# Patient Record
Sex: Female | Born: 1940 | Race: White | Hispanic: No | Marital: Married | Smoking: Current every day smoker
Health system: Southern US, Community
[De-identification: ages and names within clinical notes are randomized; demographics above are authoritative.]

## PROBLEM LIST (undated history)

## (undated) DIAGNOSIS — I499 Cardiac arrhythmia, unspecified: Secondary | ICD-10-CM

## (undated) DIAGNOSIS — N189 Chronic kidney disease, unspecified: Secondary | ICD-10-CM

## (undated) DIAGNOSIS — J449 Chronic obstructive pulmonary disease, unspecified: Secondary | ICD-10-CM

## (undated) DIAGNOSIS — R251 Tremor, unspecified: Secondary | ICD-10-CM

## (undated) DIAGNOSIS — E119 Type 2 diabetes mellitus without complications: Secondary | ICD-10-CM

## (undated) DIAGNOSIS — E785 Hyperlipidemia, unspecified: Secondary | ICD-10-CM

## (undated) DIAGNOSIS — M1990 Unspecified osteoarthritis, unspecified site: Secondary | ICD-10-CM

## (undated) HISTORY — PX: TUBAL LIGATION: SHX77

---

## 2003-02-05 ENCOUNTER — Encounter: Payer: Self-pay | Admitting: Family Medicine

## 2003-02-05 ENCOUNTER — Encounter: Admission: RE | Admit: 2003-02-05 | Discharge: 2003-02-05 | Payer: Self-pay | Admitting: Family Medicine

## 2005-07-10 ENCOUNTER — Encounter: Admission: RE | Admit: 2005-07-10 | Discharge: 2005-07-10 | Payer: Self-pay | Admitting: Family Medicine

## 2009-07-06 ENCOUNTER — Encounter: Admission: RE | Admit: 2009-07-06 | Discharge: 2009-07-06 | Payer: Self-pay | Admitting: Family Medicine

## 2010-11-05 ENCOUNTER — Encounter: Payer: Self-pay | Admitting: Family Medicine

## 2011-05-18 ENCOUNTER — Other Ambulatory Visit: Payer: Self-pay | Admitting: Family Medicine

## 2011-05-18 DIAGNOSIS — R1903 Right lower quadrant abdominal swelling, mass and lump: Secondary | ICD-10-CM

## 2011-05-18 DIAGNOSIS — Z1231 Encounter for screening mammogram for malignant neoplasm of breast: Secondary | ICD-10-CM

## 2011-05-21 ENCOUNTER — Ambulatory Visit: Payer: Self-pay

## 2011-05-21 ENCOUNTER — Ambulatory Visit
Admission: RE | Admit: 2011-05-21 | Discharge: 2011-05-21 | Disposition: A | Discharge status: Home / Self Care | Payer: Medicare Other | Source: Ambulatory Visit | Attending: Family Medicine | Admitting: Family Medicine

## 2011-05-21 DIAGNOSIS — R1903 Right lower quadrant abdominal swelling, mass and lump: Secondary | ICD-10-CM

## 2011-05-21 MED ORDER — IOHEXOL 300 MG/ML  SOLN
100.0000 mL | Freq: Once | INTRAMUSCULAR | Status: AC | PRN
  Administered 2011-05-21: 100 mL via INTRAVENOUS

## 2011-05-22 ENCOUNTER — Ambulatory Visit
Admission: RE | Admit: 2011-05-22 | Discharge: 2011-05-22 | Disposition: A | Discharge status: Home / Self Care | Payer: Medicare Other | Source: Ambulatory Visit | Attending: Family Medicine | Admitting: Family Medicine

## 2011-05-22 DIAGNOSIS — Z1231 Encounter for screening mammogram for malignant neoplasm of breast: Secondary | ICD-10-CM

## 2011-05-28 ENCOUNTER — Other Ambulatory Visit: Payer: Self-pay | Admitting: Urology

## 2011-05-28 DIAGNOSIS — Q6239 Other obstructive defects of renal pelvis and ureter: Secondary | ICD-10-CM

## 2011-05-30 ENCOUNTER — Ambulatory Visit (HOSPITAL_COMMUNITY)
Admission: RE | Admit: 2011-05-30 | Discharge: 2011-05-30 | Disposition: A | Discharge status: Home / Self Care | Payer: Medicare Other | Source: Ambulatory Visit | Attending: Urology | Admitting: Urology

## 2011-05-30 DIAGNOSIS — N1330 Unspecified hydronephrosis: Secondary | ICD-10-CM | POA: Insufficient documentation

## 2011-05-30 DIAGNOSIS — N289 Disorder of kidney and ureter, unspecified: Secondary | ICD-10-CM | POA: Insufficient documentation

## 2011-05-30 DIAGNOSIS — N135 Crossing vessel and stricture of ureter without hydronephrosis: Secondary | ICD-10-CM | POA: Insufficient documentation

## 2011-05-30 LAB — PROTIME-INR: Prothrombin Time: 13 seconds (ref 11.6–15.2)

## 2011-05-30 LAB — BASIC METABOLIC PANEL
BUN: 15 mg/dL (ref 6–23)
CO2: 28 mEq/L (ref 19–32)
Calcium: 10.1 mg/dL (ref 8.4–10.5)
GFR calc non Af Amer: >60 mL/min (ref >60)
Glucose, Bld: 125 mg/dL — ABNORMAL HIGH (ref 70–99)
Sodium: 139 mEq/L (ref 135–145)

## 2011-05-30 LAB — CBC
MCH: 33.6 pg (ref 26.0–34.0)
MCHC: 34.6 g/dL (ref 30.0–36.0)
MCV: 97 fL (ref 78.0–100.0)
Platelets: 231 10*3/uL (ref 150–400)
RBC: 4.29 MIL/uL (ref 3.87–5.11)
RDW: 13.4 % (ref 11.5–15.5)

## 2011-05-31 ENCOUNTER — Other Ambulatory Visit: Payer: Self-pay | Admitting: Urology

## 2012-03-24 ENCOUNTER — Other Ambulatory Visit: Payer: Self-pay | Admitting: Urology

## 2012-03-24 DIAGNOSIS — N135 Crossing vessel and stricture of ureter without hydronephrosis: Secondary | ICD-10-CM

## 2012-04-02 ENCOUNTER — Encounter (HOSPITAL_COMMUNITY)
Admission: RE | Admit: 2012-04-02 | Discharge: 2012-04-02 | Disposition: A | Discharge status: Home / Self Care | Payer: Medicare Other | Source: Ambulatory Visit | Attending: Urology | Admitting: Urology

## 2012-04-02 DIAGNOSIS — N135 Crossing vessel and stricture of ureter without hydronephrosis: Secondary | ICD-10-CM | POA: Insufficient documentation

## 2012-04-02 DIAGNOSIS — N1330 Unspecified hydronephrosis: Secondary | ICD-10-CM | POA: Insufficient documentation

## 2012-04-02 DIAGNOSIS — N289 Disorder of kidney and ureter, unspecified: Secondary | ICD-10-CM | POA: Insufficient documentation

## 2012-04-02 MED ORDER — TECHNETIUM TC 99M MERTIATIDE
14.7000 | Freq: Once | INTRAVENOUS | Status: AC | PRN
  Administered 2012-04-02: 14.7 via INTRAVENOUS

## 2012-04-02 MED ORDER — FUROSEMIDE 10 MG/ML IJ SOLN
40.0000 mg | Freq: Once | INTRAMUSCULAR | Status: DC
  Filled 2012-04-02: qty 4

## 2012-09-09 ENCOUNTER — Other Ambulatory Visit: Payer: Self-pay | Admitting: Radiology

## 2012-09-09 ENCOUNTER — Other Ambulatory Visit: Payer: Self-pay | Admitting: Urology

## 2012-09-09 DIAGNOSIS — N1330 Unspecified hydronephrosis: Secondary | ICD-10-CM

## 2012-09-10 ENCOUNTER — Ambulatory Visit (HOSPITAL_COMMUNITY)
Admission: RE | Admit: 2012-09-10 | Discharge: 2012-09-10 | Disposition: A | Discharge status: Home / Self Care | Payer: Medicare Other | Source: Ambulatory Visit | Attending: Urology | Admitting: Urology

## 2012-09-10 ENCOUNTER — Other Ambulatory Visit: Payer: Self-pay | Admitting: Urology

## 2012-09-10 ENCOUNTER — Encounter (HOSPITAL_COMMUNITY): Payer: Self-pay

## 2012-09-10 DIAGNOSIS — N1330 Unspecified hydronephrosis: Secondary | ICD-10-CM

## 2012-09-10 DIAGNOSIS — Q6239 Other obstructive defects of renal pelvis and ureter: Secondary | ICD-10-CM | POA: Insufficient documentation

## 2012-09-10 HISTORY — DX: Type 2 diabetes mellitus without complications: E11.9

## 2012-09-10 HISTORY — DX: Chronic obstructive pulmonary disease, unspecified: J44.9

## 2012-09-10 HISTORY — DX: Hyperlipidemia, unspecified: E78.5

## 2012-09-10 LAB — CBC WITH DIFFERENTIAL/PLATELET
Basophils Absolute: 0 10*3/uL (ref 0.0–0.1)
Eosinophils Absolute: 0.1 10*3/uL (ref 0.0–0.7)
Eosinophils Relative: 1 % (ref 0–5)
HCT: 33.8 % — ABNORMAL LOW (ref 36.0–46.0)
Lymphocytes Relative: 14 % (ref 12–46)
Lymphs Abs: 1.8 10*3/uL (ref 0.7–4.0)
MCH: 30.2 pg (ref 26.0–34.0)
MCV: 92.1 fL (ref 78.0–100.0)
Monocytes Absolute: 1 10*3/uL (ref 0.1–1.0)
Platelets: 588 10*3/uL — ABNORMAL HIGH (ref 150–400)
RDW: 14.5 % (ref 11.5–15.5)
WBC: 12.4 10*3/uL — ABNORMAL HIGH (ref 4.0–10.5)

## 2012-09-10 LAB — PROTIME-INR: Prothrombin Time: 13 seconds (ref 11.6–15.2)

## 2012-09-10 MED ORDER — LIDOCAINE HCL 1 % IJ SOLN
INTRAMUSCULAR | Status: AC
  Filled 2012-09-10: qty 20

## 2012-09-10 MED ORDER — FENTANYL CITRATE 0.05 MG/ML IJ SOLN
INTRAMUSCULAR | Status: AC | PRN
  Administered 2012-09-10: 50 ug via INTRAVENOUS

## 2012-09-10 MED ORDER — MIDAZOLAM HCL 2 MG/2ML IJ SOLN
INTRAMUSCULAR | Status: AC | PRN
  Administered 2012-09-10 (×2): 1 mg via INTRAVENOUS

## 2012-09-10 MED ORDER — CIPROFLOXACIN IN D5W 400 MG/200ML IV SOLN
400.0000 mg | Freq: Once | INTRAVENOUS | Status: AC
  Administered 2012-09-10: 400 mg via INTRAVENOUS
  Filled 2012-09-10: qty 200

## 2012-09-10 MED ORDER — MIDAZOLAM HCL 2 MG/2ML IJ SOLN
INTRAMUSCULAR | Status: AC
  Filled 2012-09-10: qty 4

## 2012-09-10 MED ORDER — FENTANYL CITRATE 0.05 MG/ML IJ SOLN
INTRAMUSCULAR | Status: AC
  Filled 2012-09-10: qty 4

## 2012-09-10 MED ORDER — ONDANSETRON HCL 4 MG/2ML IJ SOLN: 4.0000 mg | Freq: Four times a day (QID) | INTRAMUSCULAR | Status: DC | PRN

## 2012-09-10 MED ORDER — SODIUM CHLORIDE 0.9 % IV SOLN: INTRAVENOUS | Status: DC

## 2012-09-10 MED ORDER — HYDROCODONE-ACETAMINOPHEN 5-325 MG PO TABS: 1.0000 | ORAL_TABLET | ORAL | Status: DC | PRN

## 2012-09-10 NOTE — Progress Notes (Signed)
RECOVERY in Short stay post aspiration Pt warm and dry see VS on Doc Flow sheet. Pt sitting HOB 45 degrees right lower quadrant drain with 75 cc of dark cherry blood

## 2012-09-10 NOTE — Progress Notes (Signed)
Caryn Bee B here to see patient. Pt states she is feeling better VSS unchanged from previous and pt was able to sit on edge of bed dangling and bp 94/63. Pt remains W&D color pale. Still has cherry red drainage in bag but only @ 300cc is preset in bad. Took pt ot BR via w/c and voided moderate am clear urine.Pt taken back to room via w/c and assisted with dressing.Pt eager to go home Demonstated to daughter how to empty leg bag and change dressing she verbalized understand. Gave daugher sterile 4x4 dressings and hypafix.cups to measure output

## 2012-09-10 NOTE — Progress Notes (Signed)
Pt took few sips of juice and c/o nausea . Dr Bonnielee Haff here to see pt. PRN orders received for nausea. Pt took few sips of ginger ale and nausea is subsiding. Pt lying on left side and wanting to sleep

## 2012-09-10 NOTE — Progress Notes (Signed)
Chief Complaint: "I'm here for a drain" Referring Physician:Dahlstedt HPI: Cheryl Griffin is an 71 y.o. female with hx of nonfunctioning right kidney and known UPJ obstruction causing recurrent hydronephros  Past Medical History:  Past Medical History  Diagnosis Date  . COPD (chronic obstructive pulmonary disease)   . DM (diabetes mellitus)   . Hyperlipidemia     Past Surgical History: No past surgical history on file.  Family History: No family history on file.  Social History:  reports that she has been smoking.  She does not have any smokeless tobacco history on file. She reports that she does not drink alcohol or use illicit drugs.  Allergies: No Known Allergies  Medications: Glucophage 1000mg  bid Spiriva Symbicort Simvistatin  Please HPI for pertinent positives, otherwise complete 10 system ROS negative.  Physical Exam: Blood pressure 95/63, pulse 117, temperature 98.3 F (36.8 C), weight 83 lb (37.649 kg), SpO2 97.00%. There is no height on file to calculate BMI.   General Appearance:  Alert, cooperative, no distress, appears stated age  Head:  Normocephalic, without obvious abnormality, atraumatic  ENT: Unremarkable  Neck: Supple, symmetrical, trachea midline, no adenopathy, thyroid: not enlarged, symmetric, no tenderness/mass/nodules  Lungs:   Clear to auscultation bilaterally, no w/r/r, respirations unlabored without use of accessory muscles.  Chest Wall:  No tenderness or deformity  Heart:  Regular rate and rhythm, S1, S2 normal, no murmur, rub or gallop. Carotids 2+ without bruit.  Abdomen:   Large NT, but tight mass effect in Rt mid-lower abdomen c/w findings on CT.  Neurologic: Normal affect, no gross deficits.   Results for orders placed during the hospital encounter of 09/10/12 (from the past 48 hour(s))  APTT     Status: Normal   Collection Time   09/10/12 10:35 AM      Component Value Range Comment   aPTT 29  24 - 37 seconds   CBC WITH DIFFERENTIAL      Status: Abnormal   Collection Time   09/10/12 10:35 AM      Component Value Range Comment   WBC 12.4 (*) 4.0 - 10.5 K/uL    RBC 3.67 (*) 3.87 - 5.11 MIL/uL    Hemoglobin 11.1 (*) 12.0 - 15.0 g/dL    HCT 16.1 (*) 09.6 - 46.0 %    MCV 92.1  78.0 - 100.0 fL    MCH 30.2  26.0 - 34.0 pg    MCHC 32.8  30.0 - 36.0 g/dL    RDW 04.5  40.9 - 81.1 %    Platelets 588 (*) 150 - 400 K/uL    Neutrophils Relative 77  43 - 77 %    Neutro Abs 9.5 (*) 1.7 - 7.7 K/uL    Lymphocytes Relative 14  12 - 46 %    Lymphs Abs 1.8  0.7 - 4.0 K/uL    Monocytes Relative 8  3 - 12 %    Monocytes Absolute 1.0  0.1 - 1.0 K/uL    Eosinophils Relative 1  0 - 5 %    Eosinophils Absolute 0.1  0.0 - 0.7 K/uL    Basophils Relative 0  0 - 1 %    Basophils Absolute 0.0  0.0 - 0.1 K/uL   PROTIME-INR     Status: Normal   Collection Time   09/10/12 10:35 AM      Component Value Range Comment   Prothrombin Time 13.0  11.6 - 15.2 seconds    INR 0.99  0.00 - 1.49  No results found.  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 Discussed perc nephrostomy tube placement with pt and family. Risks and complications, as well as post drain expectations discussed as well. Labs ok. Consent signed in chart  Brayton El PA-C 09/10/2012, 11:21 AM

## 2012-09-10 NOTE — Procedures (Signed)
R PCN for severe UPJ obstruction 10 Fr Bloody fluid No comp

## 2012-09-14 LAB — CULTURE, ROUTINE-ABSCESS: Culture: NO GROWTH

## 2012-09-18 ENCOUNTER — Other Ambulatory Visit: Payer: Self-pay | Admitting: Urology

## 2012-09-18 DIAGNOSIS — N1330 Unspecified hydronephrosis: Secondary | ICD-10-CM

## 2012-09-29 ENCOUNTER — Encounter (HOSPITAL_COMMUNITY)
Admission: RE | Admit: 2012-09-29 | Discharge: 2012-09-29 | Disposition: A | Discharge status: Home / Self Care | Payer: Medicare Other | Source: Ambulatory Visit | Attending: Urology | Admitting: Urology

## 2012-09-29 DIAGNOSIS — N1330 Unspecified hydronephrosis: Secondary | ICD-10-CM | POA: Insufficient documentation

## 2012-09-29 MED ORDER — TECHNETIUM TC 99M MERTIATIDE
15.0000 | Freq: Once | INTRAVENOUS | Status: AC | PRN
  Administered 2012-09-29: 15 via INTRAVENOUS

## 2012-10-07 ENCOUNTER — Other Ambulatory Visit: Payer: Self-pay | Admitting: Urology

## 2012-10-07 DIAGNOSIS — N135 Crossing vessel and stricture of ureter without hydronephrosis: Secondary | ICD-10-CM

## 2012-10-10 ENCOUNTER — Other Ambulatory Visit: Payer: Self-pay | Admitting: Physician Assistant

## 2012-10-13 ENCOUNTER — Ambulatory Visit (HOSPITAL_COMMUNITY)
Admission: RE | Admit: 2012-10-13 | Discharge: 2012-10-13 | Disposition: A | Discharge status: Home / Self Care | Payer: Medicare Other | Source: Ambulatory Visit | Attending: Urology | Admitting: Urology

## 2012-10-13 DIAGNOSIS — N135 Crossing vessel and stricture of ureter without hydronephrosis: Secondary | ICD-10-CM

## 2012-10-13 MED ORDER — LIDOCAINE HCL 1 % IJ SOLN
INTRAMUSCULAR | Status: AC
  Filled 2012-10-13: qty 20

## 2012-10-16 ENCOUNTER — Other Ambulatory Visit: Payer: Self-pay | Admitting: Urology

## 2012-10-23 ENCOUNTER — Other Ambulatory Visit (HOSPITAL_COMMUNITY): Payer: Self-pay | Admitting: Urology

## 2012-10-23 NOTE — Patient Instructions (Addendum)
20 KIFFANY SCHELLING  10/23/2012   Your procedure is scheduled on:  11/04/11  MONDAY  Report to [facility] [phone].  Call this number if you have problems the morning of surgery: [phone]       Remember: Janan Halter WITH YOU TO HOSPITAL  Do not eat food  Or drink :After Midnight. Sunday NIGHT   Take these medicines the morning of surgery with A SIP OF WATER:albuterol inhaler if needed, symbicort, spiriva   .  Contacts, dentures or partial plates can not be worn to surgery  Leave suitcase in the car. After surgery it may be brought to your room.  For patients admitted to the hospital, checkout time is 11:00 AM day of  discharge.             SPECIAL INSTRUCTIONS- SEE [facility] PREPARING FOR SURGERY INSTRUCTION SHEET-     DO NOT WEAR JEWELRY, LOTIONS, POWDERS, OR PERFUMES.  WOMEN-- DO NOT SHAVE LEGS OR UNDERARMS FOR 12 HOURS BEFORE SHOWERS. MEN MAY SHAVE FACE.  Patients discharged the day of surgery will not be allowed to drive home. IF going home the day of surgery, you must have a driver and someone to stay with you for the first 24 hours  Name and phone number of your driver:  admission                                                                      Please read over the following fact sheets that you were given: MRSA Information, Incentive Spirometry Sheet, Blood Transfusion Sheet  Information                                                                                   Cain Sieve   PST nurse [phone]  [phone] phone numbe                FAILURE TO FOLLOW THESE INSTRUCTIONS MAY RESULT IN  CANCELLATION   OF YOUR SURGERY                                                  Patient Signature _____________________________

## 2012-10-24 ENCOUNTER — Encounter (HOSPITAL_COMMUNITY)
Admission: RE | Admit: 2012-10-24 | Discharge: 2012-10-24 | Disposition: A | Discharge status: Home / Self Care | Payer: Medicare Other | Source: Ambulatory Visit | Attending: Urology | Admitting: Urology

## 2012-10-24 ENCOUNTER — Other Ambulatory Visit: Payer: Self-pay

## 2012-10-24 ENCOUNTER — Ambulatory Visit (HOSPITAL_COMMUNITY)
Admission: RE | Admit: 2012-10-24 | Discharge: 2012-10-24 | Disposition: A | Discharge status: Home / Self Care | Payer: Medicare Other | Source: Ambulatory Visit | Attending: Urology | Admitting: Urology

## 2012-10-24 ENCOUNTER — Emergency Department (HOSPITAL_COMMUNITY)
Admission: EM | Admit: 2012-10-24 | Discharge: 2012-10-24 | Disposition: A | Discharge status: Home / Self Care | Payer: Medicare Other | Attending: Emergency Medicine | Admitting: Emergency Medicine

## 2012-10-24 ENCOUNTER — Encounter (HOSPITAL_COMMUNITY): Payer: Self-pay | Admitting: Pharmacy Technician

## 2012-10-24 ENCOUNTER — Encounter (HOSPITAL_COMMUNITY): Payer: Self-pay

## 2012-10-24 ENCOUNTER — Encounter (HOSPITAL_COMMUNITY): Payer: Self-pay | Admitting: *Deleted

## 2012-10-24 DIAGNOSIS — R9431 Abnormal electrocardiogram [ECG] [EKG]: Secondary | ICD-10-CM

## 2012-10-24 DIAGNOSIS — J438 Other emphysema: Secondary | ICD-10-CM | POA: Insufficient documentation

## 2012-10-24 DIAGNOSIS — N135 Crossing vessel and stricture of ureter without hydronephrosis: Secondary | ICD-10-CM | POA: Insufficient documentation

## 2012-10-24 DIAGNOSIS — Z9889 Other specified postprocedural states: Secondary | ICD-10-CM | POA: Insufficient documentation

## 2012-10-24 DIAGNOSIS — J449 Chronic obstructive pulmonary disease, unspecified: Secondary | ICD-10-CM | POA: Insufficient documentation

## 2012-10-24 DIAGNOSIS — E119 Type 2 diabetes mellitus without complications: Secondary | ICD-10-CM | POA: Insufficient documentation

## 2012-10-24 DIAGNOSIS — F17200 Nicotine dependence, unspecified, uncomplicated: Secondary | ICD-10-CM | POA: Insufficient documentation

## 2012-10-24 DIAGNOSIS — Z79899 Other long term (current) drug therapy: Secondary | ICD-10-CM | POA: Insufficient documentation

## 2012-10-24 DIAGNOSIS — Z8739 Personal history of other diseases of the musculoskeletal system and connective tissue: Secondary | ICD-10-CM | POA: Insufficient documentation

## 2012-10-24 DIAGNOSIS — J4489 Other specified chronic obstructive pulmonary disease: Secondary | ICD-10-CM | POA: Insufficient documentation

## 2012-10-24 DIAGNOSIS — Z01810 Encounter for preprocedural cardiovascular examination: Secondary | ICD-10-CM | POA: Insufficient documentation

## 2012-10-24 DIAGNOSIS — E785 Hyperlipidemia, unspecified: Secondary | ICD-10-CM | POA: Insufficient documentation

## 2012-10-24 DIAGNOSIS — Z01812 Encounter for preprocedural laboratory examination: Secondary | ICD-10-CM | POA: Insufficient documentation

## 2012-10-24 HISTORY — DX: Unspecified osteoarthritis, unspecified site: M19.90

## 2012-10-24 HISTORY — DX: Tremor, unspecified: R25.1

## 2012-10-24 LAB — CBC WITH DIFFERENTIAL/PLATELET
Basophils Absolute: 0.1 10*3/uL (ref 0.0–0.1)
Basophils Relative: 0 % (ref 0–1)
Eosinophils Relative: 0 % (ref 0–5)
HCT: 42.8 % (ref 36.0–46.0)
MCHC: 32.7 g/dL (ref 30.0–36.0)
Monocytes Absolute: 0.9 10*3/uL (ref 0.1–1.0)
Neutro Abs: 8.4 10*3/uL — ABNORMAL HIGH (ref 1.7–7.7)
Platelets: 292 10*3/uL (ref 150–400)
RDW: 16.8 % — ABNORMAL HIGH (ref 11.5–15.5)
WBC: 12.2 10*3/uL — ABNORMAL HIGH (ref 4.0–10.5)

## 2012-10-24 LAB — BASIC METABOLIC PANEL
BUN: 14 mg/dL (ref 6–23)
Calcium: 10 mg/dL (ref 8.4–10.5)
Chloride: 101 mEq/L (ref 96–112)
Creatinine, Ser: 0.57 mg/dL (ref 0.50–1.10)
GFR calc Af Amer: >90 mL/min (ref >90)
GFR calc non Af Amer: 90 mL/min — ABNORMAL LOW (ref >90)

## 2012-10-24 LAB — CBC
HCT: 41.5 % (ref 36.0–46.0)
MCHC: 32.8 g/dL (ref 30.0–36.0)
Platelets: 314 10*3/uL (ref 150–400)
RDW: 16.9 % — ABNORMAL HIGH (ref 11.5–15.5)
WBC: 11.7 10*3/uL — ABNORMAL HIGH (ref 4.0–10.5)

## 2012-10-24 LAB — COMPREHENSIVE METABOLIC PANEL
ALT: 11 U/L (ref 0–35)
AST: 14 U/L (ref 0–37)
Albumin: 3.8 g/dL (ref 3.5–5.2)
Calcium: 10 mg/dL (ref 8.4–10.5)
Chloride: 100 mEq/L (ref 96–112)
Creatinine, Ser: 0.57 mg/dL (ref 0.50–1.10)
Sodium: 137 mEq/L (ref 135–145)

## 2012-10-24 LAB — SURGICAL PCR SCREEN: Staphylococcus aureus: POSITIVE — AB

## 2012-10-24 MED ORDER — ASPIRIN 325 MG PO TABS
325.0000 mg | ORAL_TABLET | Freq: Once | ORAL | Status: AC
  Administered 2012-10-24: 325 mg via ORAL
  Filled 2012-10-24: qty 1

## 2012-10-24 NOTE — ED Notes (Signed)
MVH:QION<GE> Expected date:<BR> Expected time:<BR> Means of arrival:<BR> Comments:<BR> From pre op, st elevation, chest pain

## 2012-10-24 NOTE — ED Notes (Signed)
Pt coming from pre-op, on EKG pt having ST elevation. Denies chest pain
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
## 2012-10-24 NOTE — Progress Notes (Signed)
Pt to er for acute mi st elevation on ekg, pt denies chest pain.

## 2012-10-24 NOTE — Progress Notes (Signed)
Left message with selita brasher for dr Retta Diones  pt to er for acute mi, st elevation on ekg

## 2012-11-03 ENCOUNTER — Encounter (HOSPITAL_COMMUNITY): Admission: RE | Payer: Self-pay | Source: Ambulatory Visit

## 2012-11-03 ENCOUNTER — Inpatient Hospital Stay (HOSPITAL_COMMUNITY): Admission: RE | Admit: 2012-11-03 | Payer: Medicare Other | Source: Ambulatory Visit | Admitting: Urology

## 2012-11-03 SURGERY — NEPHRECTOMY, RADICAL, LAPAROSCOPIC, ADULT
Anesthesia: General | Laterality: Right

## 2012-11-12 ENCOUNTER — Encounter: Payer: Self-pay | Admitting: Cardiovascular Disease

## 2012-11-12 ENCOUNTER — Encounter: Payer: Self-pay | Admitting: *Deleted

## 2012-11-12 DIAGNOSIS — M1990 Unspecified osteoarthritis, unspecified site: Secondary | ICD-10-CM | POA: Insufficient documentation

## 2012-11-12 DIAGNOSIS — E119 Type 2 diabetes mellitus without complications: Secondary | ICD-10-CM | POA: Insufficient documentation

## 2012-11-12 DIAGNOSIS — E785 Hyperlipidemia, unspecified: Secondary | ICD-10-CM | POA: Insufficient documentation

## 2012-11-12 DIAGNOSIS — R251 Tremor, unspecified: Secondary | ICD-10-CM | POA: Insufficient documentation

## 2012-11-12 DIAGNOSIS — J449 Chronic obstructive pulmonary disease, unspecified: Secondary | ICD-10-CM | POA: Insufficient documentation

## 2012-11-14 ENCOUNTER — Encounter: Payer: Self-pay | Admitting: Cardiovascular Disease

## 2012-11-14 ENCOUNTER — Encounter: Payer: Self-pay | Admitting: Pulmonary Disease

## 2012-11-14 ENCOUNTER — Ambulatory Visit (INDEPENDENT_AMBULATORY_CARE_PROVIDER_SITE_OTHER): Payer: Medicare Other | Admitting: Cardiovascular Disease

## 2012-11-14 ENCOUNTER — Ambulatory Visit (INDEPENDENT_AMBULATORY_CARE_PROVIDER_SITE_OTHER): Payer: Medicare Other | Admitting: Pulmonary Disease

## 2012-11-14 VITALS — BP 108/64 | HR 97 | Temp 97.8°F | Ht 65.0 in | Wt 84.2 lb

## 2012-11-14 VITALS — BP 110/82 | HR 100 | Ht 62.0 in | Wt 83.0 lb

## 2012-11-14 DIAGNOSIS — J449 Chronic obstructive pulmonary disease, unspecified: Secondary | ICD-10-CM

## 2012-11-14 DIAGNOSIS — R0600 Dyspnea, unspecified: Secondary | ICD-10-CM

## 2012-11-14 DIAGNOSIS — E785 Hyperlipidemia, unspecified: Secondary | ICD-10-CM

## 2012-11-14 DIAGNOSIS — E119 Type 2 diabetes mellitus without complications: Secondary | ICD-10-CM

## 2012-11-14 DIAGNOSIS — Z01810 Encounter for preprocedural cardiovascular examination: Secondary | ICD-10-CM | POA: Insufficient documentation

## 2012-11-14 DIAGNOSIS — R0609 Other forms of dyspnea: Secondary | ICD-10-CM

## 2012-11-14 NOTE — Progress Notes (Signed)
Patient ID: Cheryl Griffin, female   DOB: 1941/02/25, 72 y.o.   MRN: [id number] 72 yo referred for preop clearance.  No history of heart disease No chest pain  Preop ECG normal but incorrectly read by computer as IMI.  She still smokes and has severe COPD clinically.  She has not had this followed by anyone other than Dr Jeannetta Nap.  She has coughing and occasional wheezing.  No palpitations mild dyspnea no syncopel  Compliant with meds.  No systemic steroids.  She has chronic UPJ obstruction and needs urologic surgery with Dr Retta Diones.    ROS: Denies fever, malais, weight loss, blurry vision, decreased visual acuity, cough, sputum, SOB, hemoptysis, pleuritic pain, palpitaitons, heartburn, abdominal pain, melena, lower extremity edema, claudication, or rash.  All other systems reviewed and negative   General: Affect appropriate Thin frail COPDer HEENT: normal Neck supple with no adenopathy JVP normal no bruits no thyromegaly Lungs clear with no wheezing and good diaphragmatic motion Heart:  S1/S2 no murmur,rub, gallop or click PMI normal Abdomen: benighn, BS positve, no tenderness, no AAA no bruit.  No HSM or HJR Distal pulses intact with no bruits No edema Neuro non-focal Skin warm and dry No muscular weakness  Medications Current Outpatient Prescriptions  Medication Sig Dispense Refill  . albuterol (PROVENTIL HFA;VENTOLIN HFA) 108 (90 BASE) MCG/ACT inhaler Inhale 2 puffs into the lungs every 4 (four) hours as needed. Wheezing      . budesonide-formoterol (SYMBICORT) 160-4.5 MCG/ACT inhaler Inhale 2 puffs into the lungs 2 (two) times daily.       . Calcium Citrate-Vitamin D (CITRACAL + D PO) Take 1 tablet by mouth daily.      Marland Kitchen lovastatin (MEVACOR) 20 MG tablet Take 20 mg by mouth at bedtime.       . metFORMIN (GLUCOPHAGE) 1000 MG tablet Take 500 mg by mouth 2 (two) times daily.       Marland Kitchen tiotropium (SPIRIVA) 18 MCG inhalation capsule Place 18 mcg into inhaler and inhale every morning.          Allergies Review of patient's allergies indicates no known allergies.  Family History: No family history on file.  Social History: History   Social History  . Marital Status: Married    Spouse Name: N/A    Number of Children: N/A  . Years of Education: N/A   Occupational History  . Not on file.   Social History Main Topics  . Smoking status: Current Every Day Smoker -- 1.5 packs/day for 40 years    Types: Cigarettes  . Smokeless tobacco: Never Used  . Alcohol Use: No  . Drug Use: No  . Sexually Active: Not on file   Other Topics Concern  . Not on file   Social History Narrative  . No narrative on file    Electrocardiogram: 10/24/12  SR rate 77 normal read incorrectly by computer as IMI  Assessment and Plan

## 2012-11-14 NOTE — Assessment & Plan Note (Signed)
No known heart issues no chest pain or symptoms and normal cardiac exam and ECG  She is clear from our perspective but needs to see pulmonary before having surgery
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
## 2012-11-14 NOTE — Assessment & Plan Note (Signed)
Discussed low carb diet.  Target hemoglobin A1c is 6.5 or less.  Continue current medications.  

## 2012-11-14 NOTE — Patient Instructions (Signed)
Your physician recommends that you schedule a follow-up appointment in: AS NEEDED  Your physician recommends that you continue on your current medications as directed. Please refer to the Current Medication list given to you today.  You have been referred to PULMONARY   NEXT WEEK FOR PRE OP CLEARANCE  Your physician has recommended that you have a pulmonary function test. Pulmonary Function Tests are a group of tests that measure how well air moves in and out of your lungs.  Your physician has requested that you have an echocardiogram. Echocardiography is a painless test that uses sound waves to create images of your heart. It provides your doctor with information about the size and shape of your heart and how well your heart's chambers and valves are working. This procedure takes approximately one hour. There are no restrictions for this procedure. DX DYSPNEA

## 2012-11-14 NOTE — Assessment & Plan Note (Signed)
Cholesterol is at goal.  Continue current dose of statin and diet Rx.  No myalgias or side effects.  F/U  LFT's in 6 months. No results found for this basename: LDLCALC             

## 2012-11-14 NOTE — Patient Instructions (Signed)
You are cleared for surgery from a lung standpoint You do have moderate COPD- lung capacity at 60% Stay on symbicort, spiriva You are at moderate risk due to low body weight - pl discuss risks with dr Retta Diones

## 2012-11-14 NOTE — Assessment & Plan Note (Signed)
Her lung function does not contraindicate her laparoscopic surgery. She will continue on Symbicort and Spiriva. Albuterol nebs can be used every 6 hours when necessary perioperatively. Her biggest risk factor seems to be her malnutrition and low body weight. She will discuss her risks further with the surgeon before proceeding. DVT prophylaxis can be performed as routine. Incentive spirometry can be used post op if she has significant abdominal pain post surgery

## 2012-11-14 NOTE — Assessment & Plan Note (Signed)
Her biggest risk for surgery is pulmonary as she will need genral anesthesia and intubation.  She needs to be cleared by lung doctors  Will get echo to assess for RV and LV function and R/O cor pulmonale.  Pre op PFT;s pre and post bronchodilator.  No motivation to quit smoking

## 2012-11-19 ENCOUNTER — Other Ambulatory Visit: Payer: Self-pay | Admitting: Urology

## 2012-11-19 DIAGNOSIS — N135 Crossing vessel and stricture of ureter without hydronephrosis: Secondary | ICD-10-CM

## 2012-11-20 ENCOUNTER — Ambulatory Visit (HOSPITAL_COMMUNITY): Payer: Medicare Other | Attending: Family Medicine | Admitting: Radiology

## 2012-11-20 DIAGNOSIS — R0602 Shortness of breath: Secondary | ICD-10-CM

## 2012-11-20 DIAGNOSIS — R0600 Dyspnea, unspecified: Secondary | ICD-10-CM

## 2012-11-20 DIAGNOSIS — R0989 Other specified symptoms and signs involving the circulatory and respiratory systems: Secondary | ICD-10-CM | POA: Insufficient documentation

## 2012-11-20 DIAGNOSIS — E119 Type 2 diabetes mellitus without complications: Secondary | ICD-10-CM | POA: Insufficient documentation

## 2012-11-20 DIAGNOSIS — E785 Hyperlipidemia, unspecified: Secondary | ICD-10-CM | POA: Insufficient documentation

## 2012-11-20 DIAGNOSIS — J4489 Other specified chronic obstructive pulmonary disease: Secondary | ICD-10-CM | POA: Insufficient documentation

## 2012-11-20 DIAGNOSIS — J449 Chronic obstructive pulmonary disease, unspecified: Secondary | ICD-10-CM | POA: Insufficient documentation

## 2012-11-20 DIAGNOSIS — R0609 Other forms of dyspnea: Secondary | ICD-10-CM | POA: Insufficient documentation

## 2012-11-20 NOTE — Progress Notes (Signed)
Echocardiogram performed.  

## 2012-11-25 ENCOUNTER — Ambulatory Visit (HOSPITAL_COMMUNITY): Payer: Medicare Other

## 2012-12-01 ENCOUNTER — Ambulatory Visit (HOSPITAL_COMMUNITY)
Admission: RE | Admit: 2012-12-01 | Discharge: 2012-12-01 | Disposition: A | Discharge status: Home / Self Care | Payer: Medicare Other | Source: Ambulatory Visit | Attending: Urology | Admitting: Urology

## 2012-12-01 ENCOUNTER — Other Ambulatory Visit: Payer: Self-pay | Admitting: Urology

## 2012-12-01 DIAGNOSIS — Z436 Encounter for attention to other artificial openings of urinary tract: Secondary | ICD-10-CM | POA: Insufficient documentation

## 2012-12-01 DIAGNOSIS — N135 Crossing vessel and stricture of ureter without hydronephrosis: Secondary | ICD-10-CM

## 2012-12-01 MED ORDER — LIDOCAINE HCL 1 % IJ SOLN
INTRAMUSCULAR | Status: AC
  Filled 2012-12-01: qty 20

## 2012-12-01 MED ORDER — IOHEXOL 300 MG/ML  SOLN
50.0000 mL | Freq: Once | INTRAMUSCULAR | Status: AC | PRN
  Administered 2012-12-01: 10 mL

## 2012-12-01 NOTE — Procedures (Signed)
Successful exchange of right nephrostomy tube.  No immediate complication.   

## 2012-12-03 ENCOUNTER — Encounter (HOSPITAL_COMMUNITY): Payer: Self-pay | Admitting: Pharmacy Technician

## 2012-12-03 ENCOUNTER — Other Ambulatory Visit: Payer: Self-pay | Admitting: Urology

## 2012-12-05 ENCOUNTER — Encounter (HOSPITAL_COMMUNITY): Payer: Self-pay

## 2012-12-05 ENCOUNTER — Other Ambulatory Visit: Payer: Self-pay | Admitting: Urology

## 2012-12-05 ENCOUNTER — Encounter (HOSPITAL_COMMUNITY)
Admission: RE | Admit: 2012-12-05 | Discharge: 2012-12-05 | Disposition: A | Discharge status: Home / Self Care | Payer: Medicare Other | Source: Ambulatory Visit | Attending: Urology | Admitting: Urology

## 2012-12-05 DIAGNOSIS — N189 Chronic kidney disease, unspecified: Secondary | ICD-10-CM

## 2012-12-05 DIAGNOSIS — I499 Cardiac arrhythmia, unspecified: Secondary | ICD-10-CM

## 2012-12-05 HISTORY — DX: Cardiac arrhythmia, unspecified: I49.9

## 2012-12-05 HISTORY — DX: Chronic kidney disease, unspecified: N18.9

## 2012-12-05 LAB — BASIC METABOLIC PANEL
CO2: 28 mEq/L (ref 19–32)
Chloride: 99 mEq/L (ref 96–112)
GFR calc non Af Amer: 84 mL/min — ABNORMAL LOW (ref >90)
Glucose, Bld: 135 mg/dL — ABNORMAL HIGH (ref 70–99)
Potassium: 3.9 mEq/L (ref 3.5–5.1)
Sodium: 137 mEq/L (ref 135–145)

## 2012-12-05 LAB — CBC
Hemoglobin: 13.7 g/dL (ref 12.0–15.0)
RBC: 4.37 MIL/uL (ref 3.87–5.11)
WBC: 10.2 10*3/uL (ref 4.0–10.5)

## 2012-12-05 LAB — ABO/RH: ABO/RH(D): O POS

## 2012-12-05 LAB — SURGICAL PCR SCREEN
MRSA, PCR: NEGATIVE
Staphylococcus aureus: POSITIVE — AB

## 2012-12-05 NOTE — Patient Instructions (Addendum)
20 Cheryl Griffin  12/05/2012   Your procedure is scheduled on: 2-27  -2014  Report to [facility] at    0930    AM.  Call this number if you have problems the morning of surgery: [phone]  Or Presurgical Testing [phone](Arnella Pralle)   Remember: Follow any bowel prep instructions per MD office. For Cpap use: Bring mask and tubing only.   Do not eat food:After Midnight.  May have clear liquids:up to 6 Hours before arrival. Nothing after : 0600 AM.  Clear liquids include soda, tea, black coffee, apple or grape juice, broth.  Take these medicines the morning of surgery with A SIP OF WATER:Take no meds AM of surgery. Do not take any Diabetic meds AM of. Bring Albuterol/Symbicort/ Spiriva- use as directed.   Do not wear jewelry, make-up or nail polish.  Do not wear lotions, powders, or perfumes. You may wear deodorant.  Do not shave 12 hours prior to first CHG shower(legs and under arms).(face and neck okay.)  Do not bring valuables to the hospital.  Contacts, dentures or bridgework,body piercing,  may not be worn into surgery.  Leave suitcase in the car. After surgery it may be brought to your room.  For patients admitted to the hospital, checkout time is 11:00 AM the day of discharge.   Patients discharged the day of surgery will not be allowed to drive home. Must have responsible person with you x 24 hours once discharged.  Name and phone number of your driver: [name], daughter [phone] cell  Special Instructions: CHG(Chlorhedine 4%-"Hibiclens","Betasept","Aplicare") Education officer, community Wash: see special instructions.(avoid face and genitals)   Please read over the following fact sheets that you were given: MRSA Information, Blood Transfusion fact sheet, Incentive Spirometry Instruction.    Failure to follow these instructions may result in Cancellation of your surgery.   Patient signature_______________________________________________________

## 2012-12-05 NOTE — Pre-Procedure Instructions (Addendum)
12-05-12 EKG(1'14)/Echo(2'14)- reports in Epic. ZOX(0'96) -report-Epic. 12-08-12 Pt. Notified of Staph aureus PCR screen-will use Mupirocin as directed-spoke with [name] - daughter. W. Kennon Portela

## 2012-12-10 NOTE — Progress Notes (Signed)
Route unsigned orders to Dr Retta Diones and asked him to sign orders for 12/11/12 surgery.

## 2012-12-11 ENCOUNTER — Encounter (HOSPITAL_COMMUNITY): Payer: Self-pay | Admitting: *Deleted

## 2012-12-11 ENCOUNTER — Inpatient Hospital Stay (HOSPITAL_COMMUNITY): Payer: Medicare Other | Admitting: Anesthesiology

## 2012-12-11 ENCOUNTER — Inpatient Hospital Stay (HOSPITAL_COMMUNITY)
Admission: RE | Admit: 2012-12-11 | Discharge: 2012-12-14 | DRG: 660 | Disposition: A | Discharge status: Home / Self Care | Payer: Medicare Other | Source: Ambulatory Visit | Attending: Urology | Admitting: Urology

## 2012-12-11 ENCOUNTER — Encounter (HOSPITAL_COMMUNITY): Payer: Self-pay | Admitting: Anesthesiology

## 2012-12-11 ENCOUNTER — Encounter (HOSPITAL_COMMUNITY)
Admission: RE | Disposition: A | Discharge status: Home / Self Care | Payer: Self-pay | Source: Ambulatory Visit | Attending: Urology

## 2012-12-11 DIAGNOSIS — J4489 Other specified chronic obstructive pulmonary disease: Secondary | ICD-10-CM | POA: Diagnosis present

## 2012-12-11 DIAGNOSIS — Q6239 Other obstructive defects of renal pelvis and ureter: Principal | ICD-10-CM

## 2012-12-11 DIAGNOSIS — E119 Type 2 diabetes mellitus without complications: Secondary | ICD-10-CM | POA: Diagnosis present

## 2012-12-11 DIAGNOSIS — N289 Disorder of kidney and ureter, unspecified: Secondary | ICD-10-CM | POA: Diagnosis present

## 2012-12-11 DIAGNOSIS — N1330 Unspecified hydronephrosis: Secondary | ICD-10-CM | POA: Diagnosis not present

## 2012-12-11 DIAGNOSIS — F17200 Nicotine dependence, unspecified, uncomplicated: Secondary | ICD-10-CM | POA: Diagnosis present

## 2012-12-11 DIAGNOSIS — E871 Hypo-osmolality and hyponatremia: Secondary | ICD-10-CM | POA: Diagnosis present

## 2012-12-11 DIAGNOSIS — E785 Hyperlipidemia, unspecified: Secondary | ICD-10-CM | POA: Diagnosis present

## 2012-12-11 HISTORY — PX: LAPAROSCOPIC NEPHRECTOMY: SHX1930

## 2012-12-11 LAB — TYPE AND SCREEN: Antibody Screen: NEGATIVE

## 2012-12-11 LAB — GLUCOSE, CAPILLARY
Glucose-Capillary: 137 mg/dL — ABNORMAL HIGH (ref 70–99)
Glucose-Capillary: 166 mg/dL — ABNORMAL HIGH (ref 70–99)

## 2012-12-11 SURGERY — NEPHRECTOMY, RADICAL, LAPAROSCOPIC, ADULT
Anesthesia: General | Laterality: Right | Wound class: Clean

## 2012-12-11 MED ORDER — ACETAMINOPHEN 10 MG/ML IV SOLN
1000.0000 mg | Freq: Four times a day (QID) | INTRAVENOUS | Status: AC
  Administered 2012-12-11 – 2012-12-12 (×4): 1000 mg via INTRAVENOUS
  Filled 2012-12-11 (×4): qty 100

## 2012-12-11 MED ORDER — ARTIFICIAL TEARS OP OINT
TOPICAL_OINTMENT | OPHTHALMIC | Status: DC | PRN
  Administered 2012-12-11: 1 via OPHTHALMIC

## 2012-12-11 MED ORDER — INSULIN ASPART 100 UNIT/ML ~~LOC~~ SOLN
0.0000 [IU] | Freq: Three times a day (TID) | SUBCUTANEOUS | Status: DC
  Administered 2012-12-11 – 2012-12-13 (×3): 1 [IU] via SUBCUTANEOUS
  Administered 2012-12-13: 2 [IU] via SUBCUTANEOUS

## 2012-12-11 MED ORDER — HEMOSTATIC AGENTS (NO CHARGE) OPTIME
TOPICAL | Status: DC | PRN
  Administered 2012-12-11: 1 via TOPICAL

## 2012-12-11 MED ORDER — SODIUM CHLORIDE 0.9 % IV SOLN
10.0000 mg | INTRAVENOUS | Status: DC | PRN
  Administered 2012-12-11: 20 ug/min via INTRAVENOUS

## 2012-12-11 MED ORDER — LACTATED RINGERS IV SOLN
INTRAVENOUS | Status: DC
  Administered 2012-12-11: 13:00:00 via INTRAVENOUS
  Administered 2012-12-11: 1000 mL via INTRAVENOUS

## 2012-12-11 MED ORDER — ONDANSETRON HCL 4 MG/2ML IJ SOLN
4.0000 mg | Freq: Four times a day (QID) | INTRAMUSCULAR | Status: DC | PRN
  Administered 2012-12-11: 4 mg via INTRAVENOUS
  Filled 2012-12-11: qty 2

## 2012-12-11 MED ORDER — DIPHENHYDRAMINE HCL 50 MG/ML IJ SOLN: 12.5000 mg | Freq: Four times a day (QID) | INTRAMUSCULAR | Status: DC | PRN

## 2012-12-11 MED ORDER — PROMETHAZINE HCL 25 MG/ML IJ SOLN: 6.2500 mg | INTRAMUSCULAR | Status: DC | PRN

## 2012-12-11 MED ORDER — CEFAZOLIN SODIUM 1-5 GM-% IV SOLN: 1.0000 g | INTRAVENOUS | Status: DC

## 2012-12-11 MED ORDER — BUDESONIDE-FORMOTEROL FUMARATE 160-4.5 MCG/ACT IN AERO
2.0000 | INHALATION_SPRAY | Freq: Two times a day (BID) | RESPIRATORY_TRACT | Status: DC
  Administered 2012-12-11 – 2012-12-13 (×5): 2 via RESPIRATORY_TRACT
  Filled 2012-12-11: qty 6

## 2012-12-11 MED ORDER — SODIUM CHLORIDE 0.45 % IV SOLN
INTRAVENOUS | Status: DC
  Administered 2012-12-11 (×2): via INTRAVENOUS

## 2012-12-11 MED ORDER — SODIUM CHLORIDE 0.9 % IJ SOLN: 9.0000 mL | INTRAMUSCULAR | Status: DC | PRN

## 2012-12-11 MED ORDER — PROPOFOL 10 MG/ML IV BOLUS
INTRAVENOUS | Status: DC | PRN
  Administered 2012-12-11: 100 mg via INTRAVENOUS

## 2012-12-11 MED ORDER — NEOSTIGMINE METHYLSULFATE 1 MG/ML IJ SOLN
INTRAMUSCULAR | Status: DC | PRN
  Administered 2012-12-11: 2 mg via INTRAVENOUS

## 2012-12-11 MED ORDER — ACETAMINOPHEN 10 MG/ML IV SOLN
INTRAVENOUS | Status: DC | PRN
  Administered 2012-12-11: 1000 mg via INTRAVENOUS

## 2012-12-11 MED ORDER — HYDROMORPHONE 0.3 MG/ML IV SOLN
INTRAVENOUS | Status: DC
  Administered 2012-12-11: 0.799 mg via INTRAVENOUS
  Administered 2012-12-11: 15:00:00 via INTRAVENOUS
  Administered 2012-12-11: 0.799 mg via INTRAVENOUS
  Administered 2012-12-12 (×3): 0.2 mg via INTRAVENOUS

## 2012-12-11 MED ORDER — CEFAZOLIN SODIUM-DEXTROSE 2-3 GM-% IV SOLR
INTRAVENOUS | Status: DC | PRN
  Administered 2012-12-11: 2 g via INTRAVENOUS

## 2012-12-11 MED ORDER — DIPHENHYDRAMINE HCL 12.5 MG/5ML PO ELIX: 12.5000 mg | ORAL_SOLUTION | Freq: Four times a day (QID) | ORAL | Status: DC | PRN

## 2012-12-11 MED ORDER — METFORMIN HCL 500 MG PO TABS
500.0000 mg | ORAL_TABLET | Freq: Two times a day (BID) | ORAL | Status: DC
  Administered 2012-12-11 – 2012-12-14 (×4): 500 mg via ORAL
  Filled 2012-12-11 (×8): qty 1

## 2012-12-11 MED ORDER — ALBUTEROL SULFATE HFA 108 (90 BASE) MCG/ACT IN AERS
2.0000 | INHALATION_SPRAY | RESPIRATORY_TRACT | Status: DC | PRN
  Filled 2012-12-11: qty 6.7

## 2012-12-11 MED ORDER — HYDROCODONE-ACETAMINOPHEN 5-325 MG PO TABS
1.0000 | ORAL_TABLET | ORAL | Status: DC | PRN
  Administered 2012-12-12 – 2012-12-13 (×3): 1 via ORAL
  Filled 2012-12-11 (×4): qty 1

## 2012-12-11 MED ORDER — GLYCOPYRROLATE 0.2 MG/ML IJ SOLN
INTRAMUSCULAR | Status: DC | PRN
  Administered 2012-12-11: 0.4 mg via INTRAVENOUS

## 2012-12-11 MED ORDER — TIOTROPIUM BROMIDE MONOHYDRATE 18 MCG IN CAPS
18.0000 ug | ORAL_CAPSULE | Freq: Every day | RESPIRATORY_TRACT | Status: DC
  Administered 2012-12-12 – 2012-12-13 (×2): 18 ug via RESPIRATORY_TRACT
  Filled 2012-12-11: qty 5

## 2012-12-11 MED ORDER — LIDOCAINE HCL (CARDIAC) 20 MG/ML IV SOLN
INTRAVENOUS | Status: DC | PRN
  Administered 2012-12-11: 40 mg via INTRAVENOUS

## 2012-12-11 MED ORDER — BUPIVACAINE-EPINEPHRINE 0.25% -1:200000 IJ SOLN
INTRAMUSCULAR | Status: DC | PRN
  Administered 2012-12-11: 20 mL

## 2012-12-11 MED ORDER — ONDANSETRON HCL 4 MG/2ML IJ SOLN
INTRAMUSCULAR | Status: DC | PRN
  Administered 2012-12-11: 4 mg via INTRAVENOUS

## 2012-12-11 MED ORDER — NALOXONE HCL 0.4 MG/ML IJ SOLN: 0.4000 mg | INTRAMUSCULAR | Status: DC | PRN

## 2012-12-11 MED ORDER — SIMVASTATIN 10 MG PO TABS
10.0000 mg | ORAL_TABLET | Freq: Every day | ORAL | Status: DC
  Administered 2012-12-11 – 2012-12-13 (×3): 10 mg via ORAL
  Filled 2012-12-11 (×4): qty 1

## 2012-12-11 MED ORDER — CISATRACURIUM BESYLATE (PF) 10 MG/5ML IV SOLN
INTRAVENOUS | Status: DC | PRN
  Administered 2012-12-11: 2 mg via INTRAVENOUS
  Administered 2012-12-11 (×2): 1 mg via INTRAVENOUS
  Administered 2012-12-11: 8 mg via INTRAVENOUS

## 2012-12-11 MED ORDER — LACTATED RINGERS IR SOLN
Status: DC | PRN
  Administered 2012-12-11: 1000 mL

## 2012-12-11 MED ORDER — SUFENTANIL CITRATE 50 MCG/ML IV SOLN
INTRAVENOUS | Status: DC | PRN
  Administered 2012-12-11 (×2): 10 ug via INTRAVENOUS
  Administered 2012-12-11 (×2): 5 ug via INTRAVENOUS
  Administered 2012-12-11 (×2): 10 ug via INTRAVENOUS

## 2012-12-11 MED ORDER — PHENYLEPHRINE HCL 10 MG/ML IJ SOLN
INTRAMUSCULAR | Status: DC | PRN
  Administered 2012-12-11: 80 ug via INTRAVENOUS
  Administered 2012-12-11: 40 ug via INTRAVENOUS
  Administered 2012-12-11 (×2): 80 ug via INTRAVENOUS
  Administered 2012-12-11: 40 ug via INTRAVENOUS
  Administered 2012-12-11: 80 ug via INTRAVENOUS

## 2012-12-11 MED ORDER — HYDROMORPHONE HCL PF 1 MG/ML IJ SOLN: 0.2500 mg | INTRAMUSCULAR | Status: DC | PRN

## 2012-12-11 SURGICAL SUPPLY — 76 items
ADH SKN CLS APL DERMABOND .7 (GAUZE/BANDAGES/DRESSINGS) ×1
APL SKNCLS STERI-STRIP NONHPOA (GAUZE/BANDAGES/DRESSINGS)
BAG SPEC RTRVL LRG 6X4 10 (ENDOMECHANICALS) ×1
BAG SPEC THK2 15X12 ZIP CLS (MISCELLANEOUS)
BAG ZIPLOCK 12X15 (MISCELLANEOUS) ×1 IMPLANT
BENZOIN TINCTURE PRP APPL 2/3 (GAUZE/BANDAGES/DRESSINGS) ×1 IMPLANT
BLADE EXTENDED COATED 6.5IN (ELECTRODE) IMPLANT
BLADE SURG SZ10 CARB STEEL (BLADE) ×2 IMPLANT
CANISTER SUCTION 2500CC (MISCELLANEOUS) ×2 IMPLANT
CHLORAPREP W/TINT 26ML (MISCELLANEOUS) ×2 IMPLANT
CLIP LIGATING HEM O LOK PURPLE (MISCELLANEOUS) ×6 IMPLANT
CLIP LIGATING HEMO LOK XL GOLD (MISCELLANEOUS) ×3 IMPLANT
CLIP LIGATING HEMO O LOK GREEN (MISCELLANEOUS) ×6 IMPLANT
CLOTH BEACON ORANGE TIMEOUT ST (SAFETY) ×2 IMPLANT
COVER SURGICAL LIGHT HANDLE (MISCELLANEOUS) ×2 IMPLANT
CUTTER FLEX LINEAR 45M (STAPLE) IMPLANT
DERMABOND ADVANCED (GAUZE/BANDAGES/DRESSINGS) ×1
DERMABOND ADVANCED .7 DNX12 (GAUZE/BANDAGES/DRESSINGS) ×1 IMPLANT
DRAIN CHANNEL 15F RND FF 3/16 (WOUND CARE) ×1 IMPLANT
DRAPE INCISE IOBAN 66X45 STRL (DRAPES) ×2 IMPLANT
DRAPE LAPAROSCOPIC ABDOMINAL (DRAPES) ×2 IMPLANT
DRAPE UTILITY XL STRL (DRAPES) ×2 IMPLANT
DRAPE WARM FLUID 44X44 (DRAPE) ×1 IMPLANT
ELECT REM PT RETURN 9FT ADLT (ELECTROSURGICAL) ×2
ELECTRODE REM PT RTRN 9FT ADLT (ELECTROSURGICAL) ×1 IMPLANT
EVACUATOR SILICONE 100CC (DRAIN) ×1 IMPLANT
FLOSEAL (HEMOSTASIS) IMPLANT
FLOSEAL 10ML (HEMOSTASIS) ×1 IMPLANT
GLOVE BIO SURGEON STRL SZ 6.5 (GLOVE) ×2 IMPLANT
GLOVE BIOGEL M 8.0 STRL (GLOVE) ×2 IMPLANT
GOWN STRL NON-REIN LRG LVL3 (GOWN DISPOSABLE) ×1 IMPLANT
GOWN STRL REIN XL XLG (GOWN DISPOSABLE) ×4 IMPLANT
HEMOSTAT SURGICEL 4X8 (HEMOSTASIS) IMPLANT
KIT BASIN OR (CUSTOM PROCEDURE TRAY) ×2 IMPLANT
NEEDLE HYPO 22GX1.5 SAFETY (NEEDLE) ×2 IMPLANT
PENCIL BUTTON HOLSTER BLD 10FT (ELECTRODE) ×2 IMPLANT
POSITIONER SURGICAL ARM (MISCELLANEOUS) ×4 IMPLANT
POUCH ENDO CATCH II 15MM (MISCELLANEOUS) ×1 IMPLANT
POUCH SPECIMEN RETRIEVAL 10MM (ENDOMECHANICALS) ×2 IMPLANT
RELOAD 45 VASCULAR/THIN (ENDOMECHANICALS) IMPLANT
RELOAD STAPLE 45 2.5 WHT GRN (ENDOMECHANICALS) IMPLANT
SCALPEL HARMONIC ACE (MISCELLANEOUS) ×2 IMPLANT
SCISSORS LAP 5X35 DISP (ENDOMECHANICALS) ×2 IMPLANT
SET IRRIG TUBING LAPAROSCOPIC (IRRIGATION / IRRIGATOR) ×2 IMPLANT
SOLUTION ANTI FOG 6CC (MISCELLANEOUS) ×2 IMPLANT
SPONGE GAUZE 4X4 12PLY (GAUZE/BANDAGES/DRESSINGS) ×1 IMPLANT
SPONGE LAP 18X18 X RAY DECT (DISPOSABLE) ×1 IMPLANT
SPONGE SURGIFOAM ABS GEL 100 (HEMOSTASIS) IMPLANT
STAPLER VISISTAT 35W (STAPLE) ×2 IMPLANT
STRIP CLOSURE SKIN 1/4X4 (GAUZE/BANDAGES/DRESSINGS) ×1 IMPLANT
SUT CHROMIC 3 0 SH 27 (SUTURE) ×2 IMPLANT
SUT ETHILON 3 0 PS 1 (SUTURE) IMPLANT
SUT MNCRL AB 4-0 PS2 18 (SUTURE) ×2 IMPLANT
SUT PDS AB 1 CT1 27 (SUTURE) ×2 IMPLANT
SUT PDS AB 1 CTX 36 (SUTURE) ×1 IMPLANT
SUT VIC AB 2-0 CT1 27 (SUTURE)
SUT VIC AB 2-0 CT1 27XBRD (SUTURE) ×1 IMPLANT
SUT VIC AB 2-0 SH 27 (SUTURE)
SUT VIC AB 2-0 SH 27X BRD (SUTURE) ×1 IMPLANT
SUT VIC AB 3-0 SH 27 (SUTURE)
SUT VIC AB 3-0 SH 27XBRD (SUTURE) ×1 IMPLANT
SUT VIC AB 4-0 RB1 27 (SUTURE)
SUT VIC AB 4-0 RB1 27XBRD (SUTURE) ×2 IMPLANT
SUT VICRYL 0 UR6 27IN ABS (SUTURE) ×5 IMPLANT
SUT VICRYL RAPIDE 4/0 PS 2 (SUTURE) ×1 IMPLANT
SYR BULB IRRIGATION 50ML (SYRINGE) ×2 IMPLANT
SYR CONTROL 10ML LL (SYRINGE) ×2 IMPLANT
TOWEL OR 17X26 10 PK STRL BLUE (TOWEL DISPOSABLE) ×2 IMPLANT
TOWEL OR NON WOVEN STRL DISP B (DISPOSABLE) ×2 IMPLANT
TRAY FOLEY CATH 14FRSI W/METER (CATHETERS) ×2 IMPLANT
TRAY LAP CHOLE (CUSTOM PROCEDURE TRAY) ×2 IMPLANT
TROCAR BLADELESS OPT 5 75 (ENDOMECHANICALS) ×4 IMPLANT
TROCAR XCEL 12X100 BLDLESS (ENDOMECHANICALS) ×3 IMPLANT
TROCAR XCEL BLUNT TIP 100MML (ENDOMECHANICALS) ×2 IMPLANT
TUBING INSUFFLATION 10FT LAP (TUBING) ×2 IMPLANT
YANKAUER SUCT BULB TIP 10FT TU (MISCELLANEOUS) ×2 IMPLANT

## 2012-12-11 NOTE — Transfer of Care (Signed)
Immediate Anesthesia Transfer of Care Note  Patient: Cheryl Griffin  Procedure(s) Performed: Procedure(s) with comments: LAPAROSCOPIC NEPHRECTOMY (Right) - LAPAROSCOPIC NEPHRECTOMY  Patient Location: PACU  Anesthesia Type:General  Level of Consciousness: awake, alert  and oriented  Airway & Oxygen Therapy: Patient Spontanous Breathing and Patient connected to face mask oxygen  Post-op Assessment: Report given to PACU RN and Post -op Vital signs reviewed and stable  Post vital signs: Reviewed and stable  Complications: No apparent anesthesia complications
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
## 2012-12-11 NOTE — Op Note (Signed)
Preoperative diagnosis: Hydronephrotic, nonfunctioning right kidney  Postoperative diagnosis: Same   Procedure: Laparoscopic-assisted right nephrectomy    Surgeon: Bertram Millard. Hikari Tripp, M.D.   Assistant: Pecola Leisure, PA-C  Anesthesia: Gen.   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 We have tried to manage this conservatively, but with this, she presents with a huge mass in her abdomen from the urine filled kidney. She has had a nephrostomy tube placed in late 2013 for decompression and management of her urinary output. Renal scan performed recently revealed no significant function of the right kidney, with no emptying with Lasix administration. I have explained to the patient and her daughter, Clydie Braun that the best option is to remove her kidney, and not to perform a UPJ repair/pyeloplasty. I have discussed risks and complications of the procedure at length with the patient and her daughter. She has been cleared from a pulmonary and cardiac standpoint. Risks and complications include bleeding, need for transfusion, need to convert to an open procedure, infection, injury to surrounding organs, pneumonia, DVT, PE, among others. She understands these and desires to proceed.    Technique and findings: The patient was properly identified and marked in the holding area. She received 2 g of Ancef intravenously. She was taken the operating room where general anesthetic was administered with endotracheal apparatus. Bladder was decompressed with a Foley catheter. She was then placed in the right side up position, with the table flexed. A beanbag was used for proper positioning. The patient was taped and fixed to the table. An axillary roll was placed. All extremities were padded appropriately. Proper timeout was then performed.  Her  abdomen was then prepped with chlor prep and draped. Ioban drape was placed. A 15 mm incision was then made in the midline inferior to the umbilicus and carried through the basically nonexistent subcutaneous space. Dissection was carried down to the rectus fascia which was then easily divided the bladder cautery. Access was then gained to the peritoneum. Stay sutures were placed in the edges of the incision. A son cannula was then placed and pneumoperitoneum was then performed. Initially, 2 other trochars were placed, one in the right lower quadrant just lateral and inferior to the umbilicus. The second was placed just to the right of the midline in the subxiphoid region. 5 mm trocar was placed in the upper abdomen, a 12 mm trocar in the lower abdomen. The abdomen was then inspected. There was a mass in the retroperitoneum, I first had a hard time distinguishing this from the colon. The white line of told was then incised with the harmonic scalpel. Dissection was carried down inferior, where intestinal adhesions were noted on the right side. These were incised sharply. Eventual plane of dissection was carried out on the medial aspect of the abdominal mass/kidney. This easily was created, with the colon reflected medially. I then dissected inferior to the kidney. It was obvious that there had been some inflammation, with the usual tissue planes obscured. Eventually found the right ureter, this was skeletonized and doubly clipped and divided. Dissection was then carried posterior and lateral to the kidney with the harmonic scalpel. Additionally, superior dissection was carried out between the kidney and the liver. It was evident that there were some vessels coming in superiorly to the kidney.  Dissection to the inferior part of the dissection medially and I carried the dissection  superiorly. I then identified at least 2 small vessels a were reminiscent of arteries. These were doubly clipped medially and singly  laterally and divided sharply. By this time, the entire posterior dissection had been performed. The dissection was then carried farther superiorly, where the remaining vessels were identified. There was one large vein superiorly. This was doubly clipped superiorly and singly clipped inferiorly and divided. The remaining small arteries were identified, clipped appropriately and divided. At this point, the whole kidney was freed up. The dissection site over the great vessels was inspected, and all clips were inspected. Excellent hemostasis was noted. Surgi-Flo was placed over the superior/peritoneal dissection.  At this point, the 12 mm trocar was removed, and the suture passer was used to pass a 0 Vicryl suture through the superior and inferior fascial edges of the right lower quadrant trocar site. The Hassan cannula was then removed, and the Endo Catch bag was passed through the midline incision. The kidney was entrapped within the bag, and a single sponge which had been placed to assist with hemostasis was removed along with the kidney using the Endo Catch bag. The lower abdominal incision was opened just long enough to remove the bag and kidney. The upper abdominal trocar sites were then removed. I used a finger through the abdominal wall underneath the trocar sites to assure that no bleeding was present at this time. It should be noted that a second midline trocar was placed in the upper abdomen to assist with  The dissection. The previously placed suture in the right lower quadrant trocar site was tied. The midline incision was closed with interrupted #1 PDS sutures placed in a simple fashion. Because of the patient's then abdominal wall, the knots were buried. Skin edges were all then reapproximated in a subcuticular fashion using 4-0 Monocryl. Dermabond was placed over the incisions.  The patient was then awakened. She was taken to the PACU in stable condition. Estimated blood loss was less than 100 cc. She  tolerated the procedure well.

## 2012-12-11 NOTE — Preoperative (Signed)
Beta Blockers   Reason not to administer Beta Blockers:Not Applicable 

## 2012-12-11 NOTE — Anesthesia Preprocedure Evaluation (Addendum)
Anesthesia Evaluation  Patient identified by MRN, date of birth, ID band Patient awake    Reviewed: Allergy & Precautions, H&P , NPO status , Patient's Chart, lab work & pertinent test results  Airway Mallampati: II TM Distance: >3 FB Neck ROM: Full    Dental no notable dental hx.    Pulmonary neg pulmonary ROS, COPD COPD inhaler, Current Smoker,  breath sounds clear to auscultation  Pulmonary exam normal       Cardiovascular negative cardio ROS  + dysrhythmias Rhythm:Regular Rate:Normal     Neuro/Psych negative neurological ROS  negative psych ROS   GI/Hepatic negative GI ROS, Neg liver ROS,   Endo/Other  negative endocrine ROSdiabetes, Type 2, Oral Hypoglycemic Agents  Renal/GU Renal diseasenegative Renal ROS  negative genitourinary   Musculoskeletal negative musculoskeletal ROS (+)   Abdominal   Peds negative pediatric ROS (+)  Hematology negative hematology ROS (+)   Anesthesia Other Findings   Reproductive/Obstetrics negative OB ROS                          Anesthesia Physical Anesthesia Plan  ASA: III  Anesthesia Plan: General   Post-op Pain Management:    Induction: Intravenous  Airway Management Planned: Oral ETT  Additional Equipment:   Intra-op Plan:   Post-operative Plan: Extubation in OR  Informed Consent: I have reviewed the patients History and Physical, chart, labs and discussed the procedure including the risks, benefits and alternatives for the proposed anesthesia with the patient or authorized representative who has indicated his/her understanding and acceptance.   Dental advisory given  Plan Discussed with: CRNA  Anesthesia Plan Comments:         Anesthesia Quick Evaluation

## 2012-12-12 ENCOUNTER — Encounter (HOSPITAL_COMMUNITY): Payer: Self-pay | Admitting: Urology

## 2012-12-12 LAB — HEMOGLOBIN AND HEMATOCRIT, BLOOD: Hemoglobin: 12.7 g/dL (ref 12.0–15.0)

## 2012-12-12 LAB — BASIC METABOLIC PANEL
BUN: 9 mg/dL (ref 6–23)
Creatinine, Ser: 0.69 mg/dL (ref 0.50–1.10)
GFR calc Af Amer: >90 mL/min (ref >90)
GFR calc non Af Amer: 84 mL/min — ABNORMAL LOW (ref >90)

## 2012-12-12 LAB — GLUCOSE, CAPILLARY

## 2012-12-12 MED ORDER — PNEUMOCOCCAL VAC POLYVALENT 25 MCG/0.5ML IJ INJ
0.5000 mL | INJECTION | INTRAMUSCULAR | Status: AC
  Filled 2012-12-12 (×2): qty 0.5

## 2012-12-12 MED ORDER — SODIUM CHLORIDE 0.9 % IV SOLN
INTRAVENOUS | Status: DC
  Administered 2012-12-12: 17:00:00 via INTRAVENOUS
  Administered 2012-12-12: 125 mL/h via INTRAVENOUS

## 2012-12-12 MED ORDER — ACETAMINOPHEN 500 MG PO TABS
500.0000 mg | ORAL_TABLET | Freq: Four times a day (QID) | ORAL | Status: DC | PRN
  Administered 2012-12-12: 500 mg via ORAL
  Filled 2012-12-12: qty 1

## 2012-12-12 NOTE — Progress Notes (Signed)
1 Day Post-Op Subjective: Patient reports that she feels well. She has no nausea or pain. She walked last night  Objective: Vital signs in last 24 hours: Temp:  [97.6 F (36.4 C)-98.8 F (37.1 C)] 97.8 F (36.6 C) (02/28 0345) Pulse Rate:  [78-126] 82 (02/28 0400) Resp:  [9-20] 12 (02/28 0400) BP: (101-149)/(46-87) 101/46 mmHg (02/28 0400) SpO2:  [96 %-100 %] 100 % (02/28 0400) Weight:  [42 kg (92 lb 9.5 oz)-43.9 kg (96 lb 12.5 oz)] 42 kg (92 lb 9.5 oz) (02/28 0400)  Intake/Output from previous day: 02/27 0701 - 02/28 0700 In: 4444 [P.O.:75; I.V.:3969; IV Piggyback:400] Out: 2050 [Urine:2000; Blood:50] Intake/Output this shift:    Physical Exam:  Constitutional: Vital signs reviewed. WD WN in NAD   Eyes: PERRL, No scleral icterus.   Cardiovascular: RRR Pulmonary/Chest: Normal effort. She has excellent respiratory effort. Abdominal: Soft. Non-tender, non-distended, bowel sounds are slightly diminished, no masses, organomegaly, or guarding present. Incisions are healing wel  Lab Results:  Recent Labs  12/12/12 0342  HGB 12.7  HCT 38.2   BMET  Recent Labs  12/12/12 0342  NA 129*  K 4.4  CL 94*  CO2 26  GLUCOSE 105*  BUN 9  CREATININE 0.69  CALCIUM 8.6   No results found for this basename: LABPT, INR,  in the last 72 hours No results found for this basename: LABURIN,  in the last 72 hours Results for orders placed during the hospital encounter of 12/05/12  SURGICAL PCR SCREEN     Status: Abnormal   Collection Time    12/05/12  3:20 PM      Result Value Range Status   MRSA, PCR NEGATIVE  NEGATIVE Final   Staphylococcus aureus POSITIVE (*) NEGATIVE Final   Comment:            The Xpert SA Assay (FDA     approved for NASAL specimens     in patients over 14 years of age),     is one component of     a comprehensive surveillance     program.  Test performance has     been validated by [employer] for patients greater     than or equal to 49 year old.      It is not intended     to diagnose infection nor to     guide or monitor treatment.    Studies/Results: No results found.  Assessment/Plan:   Postoperative day #1 laparoscopic-assisted right radical nephrectomy appeared she seems to be doing well. She is tolerating liquids, we will continue her on these. I will move her up to the telemetry unit. She may be able to go home over the weekend.   LOS: 1 day   Chelsea Aus 12/12/2012, 7:56 AM

## 2012-12-12 NOTE — Progress Notes (Signed)
CARE MANAGEMENT NOTE 12/12/2012  Patient:  Cheryl Griffin, Cheryl Griffin   Account Number:  [id number]  Date Initiated:  12/12/2012  Documentation initiated by:  Moussa Wiegand  Subjective/Objective Assessment:   pt had lap. assisted right total nephrectomy on [id number]. hypotensive after or kept on iv fld and monitored over night.     Action/Plan:   from home   Anticipated DC Date:  12/15/2012   Anticipated DC Plan:  HOME/SELF CARE  In-house referral  NA      DC Planning Services  NA      [facility] Choice  NA   Choice offered to / List presented to:  NA   DME arranged  NA      DME agency  NA     HH arranged  NA      HH agency  NA   Status of service:  In process, will continue to follow Medicare Important Message given?  NA - LOS <3 / Initial given by admissions (If response is "NO", the following Medicare IM given date fields will be blank) Date Medicare IM given:   Date Additional Medicare IM given:    Discharge Disposition:    Per UR Regulation:  Reviewed for med. necessity/level of care/duration of stay  If discussed at Long Length of Stay Meetings, dates discussed:    Comments:  [id number] Earlene Plater, RN, BSN, CCM:  CHART REVIEWED AND UPDATED.  Next chart review due on [id number]. NO DISCHARGE NEEDS PRESENT AT THIS TIME. CASE MANAGEMENT [phone]

## 2012-12-12 NOTE — Progress Notes (Signed)
Report called to Irving Burton, RN.  Transferring via wheelchair to room 1402.  Family at bedside.

## 2012-12-12 NOTE — Progress Notes (Signed)
Nutrition Brief Note  Patient identified on the Malnutrition Screening Tool (MST) Report  Body mass index is 15.89 kg/(m^2). Patient meets criteria for Underweight based on current BMI.   Current diet order is Clear Liquids, patient is consuming approximately 100% of meals at this time. Labs and medications reviewed. Pt states she usually weighs around 90 lbs and per grandaughter in room pt has never weighed more than 110 lbs. Pt reports having a good appetite and eating well PTA. Pt follows a diabetic diet at home. Discussed ways to maintain weight during recovery. Pt has no questions or concerns at this time.  No nutrition interventions warranted at this time. If nutrition issues arise, please consult RD.   Ian Malkin RD, LDN Inpatient Clinical Dietitian Pager: [phone] After Hours Pager: [phone]

## 2012-12-13 LAB — GLUCOSE, CAPILLARY
Glucose-Capillary: 131 mg/dL — ABNORMAL HIGH (ref 70–99)
Glucose-Capillary: 75 mg/dL (ref 70–99)

## 2012-12-13 NOTE — Progress Notes (Signed)
Patient ID: Cheryl Griffin, female   DOB: 02-06-1941, 72 y.o.   MRN: [id number]  2 Days Post-Op Subjective: Pt doing well.  No nausea/vomiting.  No flatus.  Ambulating some.  Tolerating diet fairly well.  Objective: Vital signs in last 24 hours: Temp:  [97.3 F (36.3 C)-98.6 F (37 C)] 98 F (36.7 C) (03/01 0300) Pulse Rate:  [75-88] 75 (03/01 0300) Resp:  [14-18] 18 (03/01 0300) BP: (97-116)/(46-59) 102/59 mmHg (03/01 0300) SpO2:  [92 %-99 %] 94 % (03/01 0300) Weight:  [39.5 kg (87 lb 1.3 oz)] 39.5 kg (87 lb 1.3 oz) (02/28 1329)  Intake/Output from previous day: 02/28 0701 - 03/01 0700 In: 3061.2 [P.O.:120; I.V.:2841.2; IV Piggyback:100] Out: 2350 [Urine:2350] Intake/Output this shift:    Physical Exam:  General: Alert and oriented CV: RRR Lungs: Clear Abdomen: Soft, ND Incisions: C/D/I Ext: NT, No erythema  Lab Results:  Recent Labs  12/12/12 0342  HGB 12.7  HCT 38.2   BMET  Recent Labs  12/12/12 0342  NA 129*  K 4.4  CL 94*  CO2 26  GLUCOSE 105*  BUN 9  CREATININE 0.69  CALCIUM 8.6     Studies/Results: No results found.  Assessment/Plan: - D/C catheter - SL IVF - Regular diet - Ambulate, IS - Monitor renal function - Po pain medication - D/C home later today or tomorrow   LOS: 2 days   Jamin Humphries,LES 12/13/2012, 7:18 AM

## 2012-12-14 LAB — BASIC METABOLIC PANEL
BUN: 7 mg/dL (ref 6–23)
Chloride: 101 mEq/L (ref 96–112)
GFR calc Af Amer: >90 mL/min (ref >90)
Potassium: 3.7 mEq/L (ref 3.5–5.1)

## 2012-12-14 LAB — GLUCOSE, CAPILLARY: Glucose-Capillary: 127 mg/dL — ABNORMAL HIGH (ref 70–99)

## 2012-12-14 MED ORDER — HYDROCODONE-ACETAMINOPHEN 5-325 MG PO TABS: 1.0000 | ORAL_TABLET | Freq: Four times a day (QID) | ORAL | Status: DC | PRN

## 2012-12-14 MED ORDER — DOCUSATE SODIUM 100 MG PO CAPS: 100.0000 mg | ORAL_CAPSULE | Freq: Two times a day (BID) | ORAL | Status: DC

## 2012-12-14 NOTE — Progress Notes (Signed)
Pt has a manual BP 158/70. MD Laverle Patter was paged and is not concerned unless it goes over 180. RN will continue to monitor.

## 2012-12-14 NOTE — Discharge Summary (Signed)
  Date of admission: 12/11/2012  Date of discharge: 12/14/2012  Admission diagnosis: Right non-functioning kidney, right hydronephrosis  Discharge diagnosis: Same  Secondary diagnoses: Diabetes  History and Physical: For full details, please see admission history and physical. Briefly, Cheryl Griffin is a 72 y.o. year old patient with right flank pain due to right hydronephrosis with a non-functioning kidney.   Hospital Course: She underwent a laparoscopic nephrectomy on 12/12/11.  She remained hemodynamically stable postoperatively with stable renal function.  Her diet was gradually advanced and she was able to ambulate. She was mildly hyponatremic on POD#1 but this was normalized by the morning of POD#3. By the morning of POD# 3 she was felt stable for discharge.  Laboratory values:  Recent Labs  12/12/12 0342  HGB 12.7  HCT 38.2    Recent Labs  12/12/12 0342 12/14/12 0435  CREATININE 0.69 0.63    Disposition: Home  Discharge instruction: The patient was instructed to be ambulatory but told to refrain from heavy lifting, strenuous activity, or driving.   Discharge medications:    Medication List    TAKE these medications       albuterol 108 (90 BASE) MCG/ACT inhaler  Commonly known as:  PROVENTIL HFA;VENTOLIN HFA  Inhale 2 puffs into the lungs every 4 (four) hours as needed. Wheezing     budesonide-formoterol 160-4.5 MCG/ACT inhaler  Commonly known as:  SYMBICORT  Inhale 2 puffs into the lungs 2 (two) times daily.     docusate sodium 100 MG capsule  Commonly known as:  COLACE  Take 1 capsule (100 mg total) by mouth 2 (two) times daily.     HYDROcodone-acetaminophen 5-325 MG per tablet  Commonly known as:  NORCO/VICODIN  Take 1-2 tablets by mouth every 6 (six) hours as needed for pain.     lovastatin 20 MG tablet  Commonly known as:  MEVACOR  Take 20 mg by mouth at bedtime.     metFORMIN 1000 MG tablet  Commonly known as:  GLUCOPHAGE  Take 500 mg by mouth 2  (two) times daily. Takes 1/2 tablet     tiotropium 18 MCG inhalation capsule  Commonly known as:  SPIRIVA  Place 18 mcg into inhaler and inhale every morning.        Followup:      Follow-up Information   Follow up with Denna Haggard, NP On 01/02/2013. (at 10:15)    Contact information:   [address] [place] [address] [phone]

## 2012-12-14 NOTE — Care Management Note (Signed)
    Page 1 of 2   12/14/2012     12:26:54 PM   CARE MANAGEMENT NOTE 12/14/2012  Patient:  STEPAHNIE, CAMPO   Account Number:  [id number]  Date Initiated:  12/12/2012  Documentation initiated by:  DAVIS,RHONDA  Subjective/Objective Assessment:   pt had lap. assisted right total nephrectomy on [id number]. hypotensive after or kept on iv fld and monitored over night.     Action/Plan:   from home   Anticipated DC Date:  12/14/2012   Anticipated DC Plan:  HOME/SELF CARE  In-house referral  NA      DC Planning Services  NA      [facility] Choice  NA   Choice offered to / List presented to:  NA   DME arranged  NA      DME agency  NA     HH arranged  NA      HH agency  NA   Status of service:  Completed, signed off Medicare Important Message given?  NA - LOS <3 / Initial given by admissions (If response is "NO", the following Medicare IM given date fields will be blank) Date Medicare IM given:   Date Additional Medicare IM given:    Discharge Disposition:  HOME/SELF CARE  Per UR Regulation:  Reviewed for med. necessity/level of care/duration of stay  If discussed at Long Length of Stay Meetings, dates discussed:    Comments:  12/14/12 [facility] RN,BSN NCM [phone] D/C HOME NO NEEDS OR ORDERS. [id number] Earlene Plater, RN, BSN, CCM:  CHART REVIEWED AND UPDATED.  Next chart review due on [id number]. NO DISCHARGE NEEDS PRESENT AT THIS TIME. CASE MANAGEMENT [phone]

## 2012-12-15 NOTE — Anesthesia Postprocedure Evaluation (Signed)
  Anesthesia Post-op Note  Patient: Cheryl Griffin  Procedure(s) Performed: Procedure(s) (LRB): LAPAROSCOPIC NEPHRECTOMY (Right)  Patient Location: PACU  Anesthesia Type: General  Level of Consciousness: awake and alert   Airway and Oxygen Therapy: Patient Spontanous Breathing  Post-op Pain: mild  Post-op Assessment: Post-op Vital signs reviewed, Patient's Cardiovascular Status Stable, Respiratory Function Stable, Patent Airway and No signs of Nausea or vomiting  Last Vitals:  Filed Vitals:   12/14/12 0411  BP: 157/71  Pulse: 81  Temp: 36.6 C  Resp: 18    Post-op Vital Signs: stable   Complications: No apparent anesthesia complications

## 2013-01-19 ENCOUNTER — Other Ambulatory Visit (HOSPITAL_COMMUNITY): Payer: Medicare Other

## 2013-03-13 ENCOUNTER — Encounter: Payer: Self-pay | Admitting: Adult Health

## 2013-03-13 ENCOUNTER — Ambulatory Visit (INDEPENDENT_AMBULATORY_CARE_PROVIDER_SITE_OTHER): Payer: Medicare Other | Admitting: Adult Health

## 2013-03-13 VITALS — BP 106/80 | HR 110 | Temp 98.4°F | Ht 64.0 in | Wt 89.8 lb

## 2013-03-13 DIAGNOSIS — J449 Chronic obstructive pulmonary disease, unspecified: Secondary | ICD-10-CM

## 2013-03-13 NOTE — Patient Instructions (Addendum)
Continue on Spiriva and Symbicort -brush/rinse/gargle after use.  MOST IMPORTANT GOAL IS TO QUIT SMOKING.  Follow up with Dr. Vassie Loll  In 4 months and As needed

## 2013-03-13 NOTE — Progress Notes (Signed)
  Subjective:    Patient ID: Cheryl Griffin, female    DOB: Dec 04, 1940, 72 y.o.   MRN: [id number]  HPI  PCP Dr Jeannetta Nap. 72 yo referred for preop pulmonary clearance prior to planned laparoscopic nephrectomy. She has chronic UPJ obstruction and needs urologic surgery with Dr Retta Diones.  She was told by PCP that she has COPD and has been maintained on Spiriva and Symbicort for at least 3 years. She smokes pack and a half per day since her teenage years. She is only quit once for a few weeks about 10 years ago.  She has coughing and occasional wheezing. No palpitations mild dyspnea no syncopel Compliant with meds. No systemic steroids. She can walk around the house and can push a cart in the store but does not seem to be very active. She reports early morning cough but denies frequent chest colds. She denies orthopnea paroxysmal nocturnal dyspnea. She does not have to climb stairs. Her activities are limited by tiredness in her legs which has not been told of any circulation problems. She was cleared by cardiology to have the surgery. Spirometry showed FEV1 60%, ratio 56, FVC 81%  She weighs only 84 pounds and has been at this weight for the last few years per her daughter.   03/13/2013 COPD follow up  4 month follow up COPD - reports has been doing well since last ov, no new complaints.  CAT = 7 CXR 10/2012 COPD changes . Remains active , works outside in the yard.  We discussed smoking cessation w/ several help quit techniques.  No fever, wt loss, hemoptysis , edema  Nephrectomy surgery went well w/ no apparent complications.       Review of Systems  Constitutional: Negative for appetite change and unexpected weight change.  HENT: Negative for ear pain, congestion, sore throat, sneezing and trouble swallowing.   Respiratory: Positive  shortness of breath with activity  Cardiovascular: Negative for chest pain, palpitations and leg swelling.  Gastrointestinal: Negative for abdominal pain.   Musculoskeletal: Negative for joint swelling.  Skin: Negative for rash.  Neurological: Negative for headaches.  Psychiatric/Behavioral: Negative for dysphoric mood. The patient is not nervous/anxious.        Objective:   Physical Exam  Gen. Pleasant, cachectic, in no distress, depressed affect ENT - no lesions, no post nasal drip Neck: No JVD, no thyromegaly, no carotid bruits Lungs: no use of accessory muscles, no dullness to percussion, decreased without rales or rhonchi  Cardiovascular: Rhythm regular, heart sounds  normal, no murmurs or gallops, no peripheral edema Abdomen: soft and non-tender, no hepatosplenomegaly, BS normal. Musculoskeletal: No deformities, no cyanosis or clubbing Neuro:  alert, non focal       Assessment & Plan:

## 2013-03-13 NOTE — Assessment & Plan Note (Signed)
Compensated on present regimen Encouraged on smoking cessation Cont on inhaler, oral care discussed

## 2013-03-17 ENCOUNTER — Telehealth: Payer: Self-pay | Admitting: Oncology

## 2013-03-17 NOTE — Telephone Encounter (Signed)
S/W PT IN RE NP APPT 06/10 @ 2 W/DR. SHADAD REFERRING DR. Cyndie Chime DX- RENAL PELVIS CA WELCOME PACKET MAILED.

## 2013-03-17 NOTE — Telephone Encounter (Signed)
C/D 03/17/13 for appt. 03/24/13

## 2013-03-20 ENCOUNTER — Other Ambulatory Visit: Payer: Self-pay | Admitting: Oncology

## 2013-03-20 DIAGNOSIS — Z85528 Personal history of other malignant neoplasm of kidney: Secondary | ICD-10-CM

## 2013-03-24 ENCOUNTER — Encounter: Payer: Self-pay | Admitting: Oncology

## 2013-03-24 ENCOUNTER — Ambulatory Visit (HOSPITAL_BASED_OUTPATIENT_CLINIC_OR_DEPARTMENT_OTHER): Payer: Medicare Other | Admitting: Oncology

## 2013-03-24 ENCOUNTER — Other Ambulatory Visit (HOSPITAL_BASED_OUTPATIENT_CLINIC_OR_DEPARTMENT_OTHER): Payer: Medicare Other | Admitting: Lab

## 2013-03-24 ENCOUNTER — Telehealth: Payer: Self-pay | Admitting: Oncology

## 2013-03-24 ENCOUNTER — Ambulatory Visit: Payer: Medicare Other

## 2013-03-24 VITALS — BP 135/76 | HR 82 | Temp 97.2°F | Resp 17 | Ht 64.0 in | Wt 87.2 lb

## 2013-03-24 DIAGNOSIS — C659 Malignant neoplasm of unspecified renal pelvis: Secondary | ICD-10-CM

## 2013-03-24 DIAGNOSIS — Z85528 Personal history of other malignant neoplasm of kidney: Secondary | ICD-10-CM

## 2013-03-24 LAB — CBC WITH DIFFERENTIAL/PLATELET
Basophils Absolute: 0.1 10*3/uL (ref 0.0–0.1)
HCT: 41.6 % (ref 34.8–46.6)
HGB: 14.2 g/dL (ref 11.6–15.9)
MONO#: 0.8 10*3/uL (ref 0.1–0.9)
NEUT%: 64.8 % (ref 38.4–76.8)
WBC: 10.1 10*3/uL (ref 3.9–10.3)
lymph#: 2.6 10*3/uL (ref 0.9–3.3)

## 2013-03-24 LAB — COMPREHENSIVE METABOLIC PANEL (CC13)
ALT: 14 U/L (ref 0–55)
BUN: 16.9 mg/dL (ref 7.0–26.0)
CO2: 27 mEq/L (ref 22–29)
Calcium: 10.2 mg/dL (ref 8.4–10.4)
Chloride: 104 mEq/L (ref 98–107)
Creatinine: 0.8 mg/dL (ref 0.6–1.1)
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
## 2013-03-24 NOTE — Telephone Encounter (Signed)
Gave pt appt for lab and MD before CT on August 2014

## 2013-03-24 NOTE — Progress Notes (Signed)
Checked in new pt with concerns about paying for medication.  I assured her that I could assist with medication that Dr. Clelia Croft prescribes for her.  Her concerns are with medication that her PCP prescribed for her and requested to talk with a Child psychotherapist.  I gave her Cheryl Griffin's card.

## 2013-05-26 ENCOUNTER — Other Ambulatory Visit (HOSPITAL_BASED_OUTPATIENT_CLINIC_OR_DEPARTMENT_OTHER): Payer: Medicare Other | Admitting: Lab

## 2013-05-26 ENCOUNTER — Ambulatory Visit (HOSPITAL_COMMUNITY)
Admission: RE | Admit: 2013-05-26 | Discharge: 2013-05-26 | Disposition: A | Discharge status: Home / Self Care | Payer: Medicare Other | Source: Ambulatory Visit | Attending: Oncology | Admitting: Oncology

## 2013-05-26 DIAGNOSIS — C659 Malignant neoplasm of unspecified renal pelvis: Secondary | ICD-10-CM

## 2013-05-26 DIAGNOSIS — I700 Atherosclerosis of aorta: Secondary | ICD-10-CM | POA: Insufficient documentation

## 2013-05-26 DIAGNOSIS — N859 Noninflammatory disorder of uterus, unspecified: Secondary | ICD-10-CM | POA: Insufficient documentation

## 2013-05-26 DIAGNOSIS — C772 Secondary and unspecified malignant neoplasm of intra-abdominal lymph nodes: Secondary | ICD-10-CM | POA: Insufficient documentation

## 2013-05-26 DIAGNOSIS — I2510 Atherosclerotic heart disease of native coronary artery without angina pectoris: Secondary | ICD-10-CM | POA: Insufficient documentation

## 2013-05-26 DIAGNOSIS — Z905 Acquired absence of kidney: Secondary | ICD-10-CM | POA: Insufficient documentation

## 2013-05-26 DIAGNOSIS — E279 Disorder of adrenal gland, unspecified: Secondary | ICD-10-CM | POA: Insufficient documentation

## 2013-05-26 DIAGNOSIS — R911 Solitary pulmonary nodule: Secondary | ICD-10-CM | POA: Insufficient documentation

## 2013-05-26 DIAGNOSIS — C649 Malignant neoplasm of unspecified kidney, except renal pelvis: Secondary | ICD-10-CM | POA: Insufficient documentation

## 2013-05-26 LAB — COMPREHENSIVE METABOLIC PANEL (CC13)
ALT: 12 U/L (ref 0–55)
Albumin: 3.2 g/dL — ABNORMAL LOW (ref 3.5–5.0)
CO2: 25 mEq/L (ref 22–29)
Calcium: 9.9 mg/dL (ref 8.4–10.4)
Chloride: 103 mEq/L (ref 98–109)
Creatinine: 0.8 mg/dL (ref 0.6–1.1)
Potassium: 4.4 mEq/L (ref 3.5–5.1)
Total Protein: 7.3 g/dL (ref 6.4–8.3)

## 2013-05-26 LAB — CBC WITH DIFFERENTIAL/PLATELET
BASO%: 0.6 % (ref 0.0–2.0)
HCT: 42.8 % (ref 34.8–46.6)
HGB: 14.2 g/dL (ref 11.6–15.9)
MCHC: 33.2 g/dL (ref 31.5–36.0)
MONO#: 0.9 10*3/uL (ref 0.1–0.9)
NEUT%: 75.1 % (ref 38.4–76.8)
RDW: 13.9 % (ref 11.2–14.5)
WBC: 11.5 10*3/uL — ABNORMAL HIGH (ref 3.9–10.3)
lymph#: 1.8 10*3/uL (ref 0.9–3.3)

## 2013-05-26 MED ORDER — IOHEXOL 300 MG/ML  SOLN
80.0000 mL | Freq: Once | INTRAMUSCULAR | Status: AC | PRN
  Administered 2013-05-26: 80 mL via INTRAVENOUS

## 2013-05-28 ENCOUNTER — Telehealth: Payer: Self-pay | Admitting: Oncology

## 2013-05-28 ENCOUNTER — Ambulatory Visit (HOSPITAL_BASED_OUTPATIENT_CLINIC_OR_DEPARTMENT_OTHER): Payer: Medicare Other | Admitting: Oncology

## 2013-05-28 VITALS — BP 88/54 | HR 94 | Temp 98.0°F | Resp 20 | Ht 64.0 in | Wt 89.2 lb

## 2013-05-28 DIAGNOSIS — Z8553 Personal history of malignant neoplasm of renal pelvis: Secondary | ICD-10-CM

## 2013-05-28 DIAGNOSIS — F17200 Nicotine dependence, unspecified, uncomplicated: Secondary | ICD-10-CM

## 2013-05-28 NOTE — Telephone Encounter (Signed)
gv and printed appt sched and avs for pt....gv pt barium   °

## 2013-05-28 NOTE — Progress Notes (Signed)
Hematology and Oncology Follow Up Visit  Cheryl Griffin [id number] 1941/03/09 72 y.o. 05/28/2013 10:10 AM Kaleen Mask, MDElkins, [name], *   Principle Diagnosis: 72 year old woman T3 N0 high grade urothelial carcinoma of the right renal pelvis diagnosed in February of 2014.   Prior Therapy: Patient is status post laparoscopic right nephrectomy in February of 2014 with the pathology revealed high-grade invasive urothelial carcinoma with tumor invades into the peripelvic connective tissue in the renal parenchyma the pathology showed a T3 N0.  Current therapy: Observation and surveillance.  Interim History:  This is a pleasant 72 year old woman I am seeing in followup after my initial consultation in June of 2014. She had her surgery back in February and have not had any complications since. She is rather frail elderly woman but seems to be improving slowly since her last visit. Her mobility has improved so does her appetite. She has gained 2 pounds and she is trying to gain some more. Her blood pressure is still borderline but she is not symptomatic from it. She still smokes heavily despite advice and recommendation against. She is not having any symptoms at this time, she is not reporting any pelvic pain or back pain. Is not reporting any shortness of breath or chest pain. She is not reporting any deterioration in her performance status her energy some affect she is improving since her operation.  Medications: I have reviewed the patient's current medications.  Current Outpatient Prescriptions  Medication Sig Dispense Refill  . albuterol (PROVENTIL HFA;VENTOLIN HFA) 108 (90 BASE) MCG/ACT inhaler Inhale 2 puffs into the lungs every 4 (four) hours as needed. Wheezing      . budesonide-formoterol (SYMBICORT) 160-4.5 MCG/ACT inhaler Inhale 2 puffs into the lungs 2 (two) times daily.       Marland Kitchen docusate sodium (COLACE) 100 MG capsule Take 1 capsule (100 mg total) by mouth 2 (two) times  daily.  30 capsule  0  . lovastatin (MEVACOR) 20 MG tablet Take 20 mg by mouth at bedtime.       . metFORMIN (GLUCOPHAGE) 1000 MG tablet Take 500 mg by mouth 2 (two) times daily.       Marland Kitchen tiotropium (SPIRIVA) 18 MCG inhalation capsule Place 18 mcg into inhaler and inhale every morning.        No current facility-administered medications for this visit.     Allergies: No Known Allergies  Past Medical History, Surgical history, Social history, and Family History were reviewed and updated.  Review of Systems: Constitutional:  Negative for fever, chills, night sweats, anorexia, weight loss, pain. Cardiovascular: no chest pain or dyspnea on exertion Respiratory: negative Neurological: negative Dermatological: negative ENT: negative Skin: Negative. Gastrointestinal: negative Genito-Urinary: negative Hematological and Lymphatic: negative Breast: negative Musculoskeletal: negative Remaining ROS negative. Physical Exam: Blood pressure 88/54, pulse 94, temperature 98 F (36.7 C), temperature source Oral, resp. rate 20, height 5\' 4"  (1.626 m), weight 89 lb 3.2 oz (40.461 kg). ECOG: 1 General appearance: alert Head: Normocephalic, without obvious abnormality, atraumatic Neck: no adenopathy, no carotid bruit, no JVD, supple, symmetrical, trachea midline and thyroid not enlarged, symmetric, no tenderness/mass/nodules Lymph nodes: Cervical, supraclavicular, and axillary nodes normal. Heart:regular rate and rhythm, S1, S2 normal, no murmur, click, rub or gallop Lung:chest clear, no wheezing, rales, normal symmetric air entry Abdomin: soft, non-tender, without masses or organomegaly EXT:no erythema, induration, or nodules   Lab Results: Lab Results  Component Value Date   WBC 11.5* 05/26/2013   HGB 14.2 05/26/2013   HCT 42.8  05/26/2013   MCV 97.1 05/26/2013   PLT 316 05/26/2013     Chemistry      Component Value Date/Time   NA 140 05/26/2013 0909   NA 138 12/14/2012 0435   K 4.4  05/26/2013 0909   K 3.7 12/14/2012 0435   CL 104 03/24/2013 1419   CL 101 12/14/2012 0435   CO2 25 05/26/2013 0909   CO2 28 12/14/2012 0435   BUN 8.2 05/26/2013 0909   BUN 7 12/14/2012 0435   CREATININE 0.8 05/26/2013 0909   CREATININE 0.63 12/14/2012 0435      Component Value Date/Time   CALCIUM 9.9 05/26/2013 0909   CALCIUM 8.8 12/14/2012 0435   ALKPHOS 84 05/26/2013 0909   ALKPHOS 80 10/24/2012 1237   AST 14 05/26/2013 0909   AST 14 10/24/2012 1237   ALT 12 05/26/2013 0909   ALT 11 10/24/2012 1237   BILITOT 0.31 05/26/2013 0909   BILITOT 0.4 10/24/2012 1237       Radiological Studies: Ct Chest W Contrast  05/26/2013   *RADIOLOGY REPORT*  Clinical Data:  Right invasive urothelial cancer status post nephrectomy in 12/2012  CT CHEST, ABDOMEN AND PELVIS WITH CONTRAST  Technique:  Multidetector CT imaging of the chest, abdomen and pelvis was performed following the standard protocol during bolus administration of intravenous contrast.  Contrast: 80mL OMNIPAQUE IOHEXOL 300 MG/ML  SOLN  Comparison:  CT abdomen pelvis dated 09/09/2012  CT CHEST  Findings:  Right apical pleural parenchymal scarring.  Additional spiculated opacity / scarring in the posterior right upper lobe (series 7/image 18).  4 mm right upper lobe pulmonary nodule (series 7/image 36).  Mild ground-glass opacity in the central right upper lobe (series 7/image 43).  Moderate centrilobular and paraseptal emphysematous changes.  No pleural effusion or pneumothorax.  Visualized thyroid is unremarkable.  The heart is normal in size.  No pericardial effusion.  Coronary atherosclerosis.  Atherosclerotic calcifications of the aortic arch.  No suspicious mediastinal, hilar, or axillary lymphadenopathy.  Visualized osseous structures are within normal limits.  IMPRESSION: No evidence of metastatic disease in the chest.  4 mm right upper lobe pulmonary nodule.  Additional spiculated opacity / scarring in the posterior right upper lobe.  While considered unlikely  to reflect metastatic disease, attention on follow-up is suggested.  Fleischner Society guidelines do not apply in the setting of known malignancy.  CT ABDOMEN AND PELVIS  Findings:  Liver, spleen, pancreas, and right adrenal gland are within normal limits.  1.8 x 1.7 cm left adrenal nodule (series 2/image 18), indeterminate on the current study, although favored to reflect an adrenal adenoma when correlating with prior unenhanced CT (as it measured negative HUs on that study).  Gallbladder is unremarkable.  No intrahepatic or extrahepatic ductal dilatation.  Left kidney is within normal limits.  No hydronephrosis.  Status post right nephrectomy.  2.1 x 2.8 cm soft tissue implant along the posterior aspect of the surgical bed (series 2/image 50).  Associated upper abdominal/retroperitoneal lymphadenopathy, including: --1.8 x 1.2 cm node posterior to the IVC (series 2/image 35) --2.1 x 1.6 cm superior aortocaval node (series 2/image 37) --1.7 x 1.3 cm peripancreatic node (series 2/image 45) --1.2 x 1.0 cm left para-aortic node (series 2/image 56) --1.5 x 2.0 cm inferior aortocaval node (series 2/image 58) --1.3 x 0.9 cm right common iliac node (series 2/image 68)  Atherosclerotic calcifications of the abdominal aorta and branch vessels.  No abdominopelvic ascites.  Uterus is notable for fundal calcifications.  No adnexal  masses.  Bladder is within normal limits.  Mild degenerative changes of the lumbar spine.  IMPRESSION: Status post right nephrectomy.  2.1 x 2.8 cm soft tissue implant along the posterior aspect of the surgical bed, worrisome for residual/recurrent tumor.  Associated upper abdominal/retroperitoneal nodal metastases, as described above.  1.8 cm left adrenal nodule, indeterminate on the current study, although favored to reflect an adrenal adenoma when correlating with prior unenhanced CT.   Original Report Authenticated By: Charline Bills, M.D.   Ct Abdomen Pelvis W Contrast  05/26/2013    *RADIOLOGY REPORT*  Clinical Data:  Right invasive urothelial cancer status post nephrectomy in 12/2012  CT CHEST, ABDOMEN AND PELVIS WITH CONTRAST  Technique:  Multidetector CT imaging of the chest, abdomen and pelvis was performed following the standard protocol during bolus administration of intravenous contrast.  Contrast: 80mL OMNIPAQUE IOHEXOL 300 MG/ML  SOLN  Comparison:  CT abdomen pelvis dated 09/09/2012  CT CHEST  Findings:  Right apical pleural parenchymal scarring.  Additional spiculated opacity / scarring in the posterior right upper lobe (series 7/image 18).  4 mm right upper lobe pulmonary nodule (series 7/image 36).  Mild ground-glass opacity in the central right upper lobe (series 7/image 43).  Moderate centrilobular and paraseptal emphysematous changes.  No pleural effusion or pneumothorax.  Visualized thyroid is unremarkable.  The heart is normal in size.  No pericardial effusion.  Coronary atherosclerosis.  Atherosclerotic calcifications of the aortic arch.  No suspicious mediastinal, hilar, or axillary lymphadenopathy.  Visualized osseous structures are within normal limits.  IMPRESSION: No evidence of metastatic disease in the chest.  4 mm right upper lobe pulmonary nodule.  Additional spiculated opacity / scarring in the posterior right upper lobe.  While considered unlikely to reflect metastatic disease, attention on follow-up is suggested.  Fleischner Society guidelines do not apply in the setting of known malignancy.  CT ABDOMEN AND PELVIS  Findings:  Liver, spleen, pancreas, and right adrenal gland are within normal limits.  1.8 x 1.7 cm left adrenal nodule (series 2/image 18), indeterminate on the current study, although favored to reflect an adrenal adenoma when correlating with prior unenhanced CT (as it measured negative HUs on that study).  Gallbladder is unremarkable.  No intrahepatic or extrahepatic ductal dilatation.  Left kidney is within normal limits.  No hydronephrosis.  Status  post right nephrectomy.  2.1 x 2.8 cm soft tissue implant along the posterior aspect of the surgical bed (series 2/image 50).  Associated upper abdominal/retroperitoneal lymphadenopathy, including: --1.8 x 1.2 cm node posterior to the IVC (series 2/image 35) --2.1 x 1.6 cm superior aortocaval node (series 2/image 37) --1.7 x 1.3 cm peripancreatic node (series 2/image 45) --1.2 x 1.0 cm left para-aortic node (series 2/image 56) --1.5 x 2.0 cm inferior aortocaval node (series 2/image 58) --1.3 x 0.9 cm right common iliac node (series 2/image 68)  Atherosclerotic calcifications of the abdominal aorta and branch vessels.  No abdominopelvic ascites.  Uterus is notable for fundal calcifications.  No adnexal masses.  Bladder is within normal limits.  Mild degenerative changes of the lumbar spine.  IMPRESSION: Status post right nephrectomy.  2.1 x 2.8 cm soft tissue implant along the posterior aspect of the surgical bed, worrisome for residual/recurrent tumor.  Associated upper abdominal/retroperitoneal nodal metastases, as described above.  1.8 cm left adrenal nodule, indeterminate on the current study, although favored to reflect an adrenal adenoma when correlating with prior unenhanced CT.   Original Report Authenticated By: Charline Bills, M.D.  Impression and Plan:  72 year old woman with the following issues:  1. High-grade uterus to a carcinoma involving the right renal pelvis. She is status post right nephrectomy on February of 2014 with pathology revealing a T3 N0 disease. She is a high risk of recurrence disease and we have agreed to only use chemotherapy for salvage and palliative purposes. Her CT scan were reviewed today although she has some borderline lymphadenopathy and some scar tissue suspicious for possible kidney bed local relapse there is no clear-cut cancer progression in symptomatology from it to warrant systemic chemotherapy. Given her age, frail status and comorbidities and age and is we  will use chemotherapy for palliative purposes and at this time I see no clear-cut evidence of threatening cancer that needs to be palliated with chemotherapy. However, the skin change pretty quickly and for that reason we will continue monitor her very closely. I will repeat his CT scan in 4 months in followup at that time.  2. Followup as mentioned will be in 4 months after a CT scan.  [facility], MD 8/14/201410:10 AM

## 2013-07-23 ENCOUNTER — Ambulatory Visit: Payer: Medicare Other | Admitting: Pulmonary Disease

## 2013-07-24 ENCOUNTER — Encounter: Payer: Self-pay | Admitting: Pulmonary Disease

## 2013-07-24 ENCOUNTER — Ambulatory Visit (INDEPENDENT_AMBULATORY_CARE_PROVIDER_SITE_OTHER): Payer: Medicare Other | Admitting: Pulmonary Disease

## 2013-07-24 VITALS — BP 138/70 | HR 117 | Ht 64.0 in | Wt 85.4 lb

## 2013-07-24 DIAGNOSIS — J449 Chronic obstructive pulmonary disease, unspecified: Secondary | ICD-10-CM

## 2013-07-24 MED ORDER — BUDESONIDE-FORMOTEROL FUMARATE 160-4.5 MCG/ACT IN AERO: 2.0000 | INHALATION_SPRAY | Freq: Two times a day (BID) | RESPIRATORY_TRACT | Status: AC

## 2013-07-24 MED ORDER — TIOTROPIUM BROMIDE MONOHYDRATE 18 MCG IN CAPS: 18.0000 ug | ORAL_CAPSULE | Freq: Every morning | RESPIRATORY_TRACT | Status: AC

## 2013-07-24 MED ORDER — ALBUTEROL SULFATE HFA 108 (90 BASE) MCG/ACT IN AERS: 2.0000 | INHALATION_SPRAY | RESPIRATORY_TRACT | Status: AC | PRN

## 2013-07-24 MED ORDER — NYSTATIN 100000 UNIT/ML MT SUSP: 500000.0000 [IU] | Freq: Two times a day (BID) | OROMUCOSAL | Status: DC

## 2013-07-24 NOTE — Progress Notes (Signed)
  Subjective:    Patient ID: Cheryl Griffin, female    DOB: 07/14/1941, 72 y.o.   MRN: [id number]  HPI  PCP Dr Jeannetta Nap.   72 yo smoker for FU of COPD Seen for preop pulmonary clearance prior to planned laparoscopic nephrectomy ([place] )  She was told by PCP that she has COPD and has been maintained on Spiriva and Symbicort since 2010. She smokes pack and a half per day since her teenage years.  She can walk around the house and can push a cart in the store but does not seem to be very active.   Spirometry showed FEV1 60%, ratio 56, FVC 81%  She weighs only 84 pounds and has been at this weight for the last few years per her daughter.  CXR 10/2012 COPD changes .    07/24/2013 5m FU - wit daughter Pt reports her breathing has been okay. C/o cough w/ slight white phlem. Denies any wheezing and no chest tx Continues to smoke 1 PPD No chest colds or wheeze or orthopnea  Review of Systems neg for any significant sore throat, dysphagia, itching, sneezing, nasal congestion or excess/ purulent secretions, fever, chills, sweats, unintended wt loss, pleuritic or exertional cp, hempoptysis, orthopnea pnd or change in chronic leg swelling. Also denies presyncope, palpitations, heartburn, abdominal pain, nausea, vomiting, diarrhea or change in bowel or urinary habits, dysuria,hematuria, rash, arthralgias, visual complaints, headache, numbness weakness or ataxia.     Objective:   Physical Exam  Gen. Pleasant, thin woman, in no distress ENT - no lesions, no post nasal drip, thrush + Neck: No JVD, no thyromegaly, no carotid bruits Lungs: no use of accessory muscles, no dullness to percussion, decreased without rales or rhonchi  Cardiovascular: Rhythm regular, heart sounds  normal, no murmurs or gallops, no peripheral edema Musculoskeletal: No deformities, no cyanosis or clubbing        Assessment & Plan:

## 2013-07-24 NOTE — Assessment & Plan Note (Signed)
Refills on symbicort/ spiriva & albuterol MDI Please make an attempt to quit - we discussed aids including electronic cigarette Nystatin swish & swallow 5ml twice daily x 7ds RINSe mouth after symbicort 

## 2013-07-24 NOTE — Patient Instructions (Signed)
Refills on symbicort/ spiriva & albuterol MDI Please make an attempt to quit - we discussed aids including electronic cigarette Nystatin swish & swallow 5ml twice daily x 7ds RINSe mouth after symbicort

## 2013-08-26 ENCOUNTER — Telehealth: Payer: Self-pay | Admitting: *Deleted

## 2013-08-26 NOTE — Telephone Encounter (Signed)
Amy from dr Jeannetta Nap office calling to say they want to move up CT scan, scheduled for 09/22/13. d/t weight loss. They will have scan done sooner.

## 2013-08-27 ENCOUNTER — Other Ambulatory Visit (HOSPITAL_COMMUNITY): Payer: Self-pay | Admitting: Family Medicine

## 2013-08-27 DIAGNOSIS — Z8553 Personal history of malignant neoplasm of renal pelvis: Secondary | ICD-10-CM

## 2013-08-28 ENCOUNTER — Ambulatory Visit (HOSPITAL_COMMUNITY)
Admission: RE | Admit: 2013-08-28 | Discharge: 2013-08-28 | Disposition: A | Discharge status: Home / Self Care | Payer: Medicare Other | Source: Ambulatory Visit | Attending: Family Medicine | Admitting: Family Medicine

## 2013-08-28 ENCOUNTER — Other Ambulatory Visit (HOSPITAL_COMMUNITY): Payer: Self-pay | Admitting: Family Medicine

## 2013-08-28 ENCOUNTER — Encounter (HOSPITAL_COMMUNITY): Payer: Self-pay

## 2013-08-28 DIAGNOSIS — J438 Other emphysema: Secondary | ICD-10-CM | POA: Insufficient documentation

## 2013-08-28 DIAGNOSIS — R918 Other nonspecific abnormal finding of lung field: Secondary | ICD-10-CM | POA: Insufficient documentation

## 2013-08-28 DIAGNOSIS — K828 Other specified diseases of gallbladder: Secondary | ICD-10-CM | POA: Insufficient documentation

## 2013-08-28 DIAGNOSIS — I2510 Atherosclerotic heart disease of native coronary artery without angina pectoris: Secondary | ICD-10-CM | POA: Insufficient documentation

## 2013-08-28 DIAGNOSIS — Z8553 Personal history of malignant neoplasm of renal pelvis: Secondary | ICD-10-CM

## 2013-08-28 DIAGNOSIS — C649 Malignant neoplasm of unspecified kidney, except renal pelvis: Secondary | ICD-10-CM | POA: Insufficient documentation

## 2013-08-28 DIAGNOSIS — Z905 Acquired absence of kidney: Secondary | ICD-10-CM | POA: Insufficient documentation

## 2013-08-28 DIAGNOSIS — N8189 Other female genital prolapse: Secondary | ICD-10-CM | POA: Insufficient documentation

## 2013-08-28 MED ORDER — IOHEXOL 300 MG/ML  SOLN
100.0000 mL | Freq: Once | INTRAMUSCULAR | Status: AC | PRN
  Administered 2013-08-28: 100 mL via INTRAVENOUS

## 2013-09-10 HISTORY — PX: OTHER SURGICAL HISTORY: SHX169

## 2013-09-21 ENCOUNTER — Telehealth: Payer: Self-pay | Admitting: Oncology

## 2013-09-21 ENCOUNTER — Encounter: Payer: Self-pay | Admitting: Oncology

## 2013-09-21 NOTE — Telephone Encounter (Signed)
s.w. pt and advised on lab b4 MD visit on 12.11.14.Marland KitchenMarland Kitchenpt ok and aware

## 2013-09-22 ENCOUNTER — Other Ambulatory Visit: Payer: Medicare Other

## 2013-09-22 ENCOUNTER — Other Ambulatory Visit: Payer: Medicare Other | Admitting: Lab

## 2013-09-22 ENCOUNTER — Ambulatory Visit (HOSPITAL_COMMUNITY): Admission: RE | Admit: 2013-09-22 | Payer: Medicare Other | Source: Ambulatory Visit

## 2013-09-24 ENCOUNTER — Other Ambulatory Visit: Payer: Medicare Other

## 2013-09-24 ENCOUNTER — Ambulatory Visit (HOSPITAL_BASED_OUTPATIENT_CLINIC_OR_DEPARTMENT_OTHER): Payer: Medicare Other | Admitting: Oncology

## 2013-09-24 VITALS — BP 66/52 | HR 138 | Temp 97.7°F | Resp 20 | Ht 64.0 in | Wt 75.4 lb

## 2013-09-24 DIAGNOSIS — C659 Malignant neoplasm of unspecified renal pelvis: Secondary | ICD-10-CM

## 2013-09-24 DIAGNOSIS — C651 Malignant neoplasm of right renal pelvis: Secondary | ICD-10-CM

## 2013-09-24 DIAGNOSIS — G893 Neoplasm related pain (acute) (chronic): Secondary | ICD-10-CM

## 2013-09-24 DIAGNOSIS — R634 Abnormal weight loss: Secondary | ICD-10-CM

## 2013-09-24 MED ORDER — MEGESTROL ACETATE 400 MG/10ML PO SUSP: 400.0000 mg | Freq: Two times a day (BID) | ORAL | Status: DC

## 2013-09-24 MED ORDER — HYDROCODONE-ACETAMINOPHEN 5-325 MG PO TABS: 1.0000 | ORAL_TABLET | Freq: Four times a day (QID) | ORAL | Status: AC | PRN

## 2013-09-24 NOTE — Progress Notes (Signed)
Per dr Clelia Croft, patient referred to [facility]. Spoke with nurse Olegario Messier.  Dr Clelia Croft to be the attendinig, hospice physicians may do symptom management and have DNR signed.

## 2013-09-24 NOTE — Progress Notes (Signed)
Hematology and Oncology Follow Up Visit  Cheryl Griffin [id number] Feb 27, 1941 72 y.o. 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 Now she has developed recurrent relapse disease.   Prior Therapy: Patient is status post laparoscopic right nephrectomy in February of 2014 with the pathology revealed high-grade invasive urothelial carcinoma with tumor invades into the peripelvic connective tissue in the renal parenchyma the pathology showed a T3 N0.  Current therapy: Observation and surveillance.  Interim History:  This is a pleasant 72 year old woman I am seeing in followup. She had her surgery back in February but recently have been declining. She is rather frail elderly woman but seems to have gotten worse in recent months. Her appetite is poor and she have lost significant amount of weight. She is having a lot of difficulty ambulating at this time. She also reported intermittent abdominal pain. It is nagging chronic pain at the right side without any associated symptoms. She is not reporting any nausea or vomiting or hematochezia. She still smokes heavily despite advice and recommendation against.   Medications: I have reviewed the patient's current medications.  Current Outpatient Prescriptions  Medication Sig Dispense Refill  . albuterol (PROVENTIL HFA;VENTOLIN HFA) 108 (90 BASE) MCG/ACT inhaler Inhale 2 puffs into the lungs every 4 (four) hours as needed. Wheezing  1 Inhaler  3  . alendronate (FOSAMAX) 70 MG tablet Take 70 mg by mouth every 7 (seven) days. Take with a full glass of water on an empty stomach.      . budesonide-formoterol (SYMBICORT) 160-4.5 MCG/ACT inhaler Inhale 2 puffs into the lungs 2 (two) times daily.  1 Inhaler  6  . lovastatin (MEVACOR) 20 MG tablet Take 20 mg by mouth at bedtime.       . metFORMIN (GLUCOPHAGE)  1000 MG tablet Take 1,000 mg by mouth 2 (two) times daily.       Marland Kitchen nystatin (MYCOSTATIN) 100000 UNIT/ML suspension Take 5 mLs (500,000 Units total) by mouth 2 (two) times daily.  70 mL  0  . polyethylene glycol (MIRALAX / GLYCOLAX) packet Take 17 g by mouth daily.      Marland Kitchen tiotropium (SPIRIVA) 18 MCG inhalation capsule Place 1 capsule (18 mcg total) into inhaler and inhale every morning.  30 capsule  6  . traMADol (ULTRAM) 50 MG tablet Take 100 mg by mouth 2 (two) times daily.      Marland Kitchen HYDROcodone-acetaminophen (NORCO) 5-325 MG per tablet Take 1 tablet by mouth every 6 (six) hours as needed for moderate pain.  30 tablet  0  . megestrol (MEGACE) 400 MG/10ML suspension Take 10 mLs (400 mg total) by mouth 2 (two) times daily.  240 mL  0   No current facility-administered medications for this visit.     Allergies: No Known Allergies  Past Medical History, Surgical history, Social history, and Family History were reviewed and updated.  Review of Systems:  Remaining ROS negative. Physical Exam: Blood pressure 66/52, pulse 138, temperature 97.7 F (36.5 C), temperature source Oral, resp. rate 20, height 5\' 4"  (1.626 m), weight 75 lb 6.4 oz (34.201 kg). ECOG: 3 General appearance: alert Head: Normocephalic, without obvious abnormality, atraumatic Neck: no adenopathy, no carotid bruit, no JVD, supple, symmetrical, trachea midline and thyroid not enlarged, symmetric, no tenderness/mass/nodules Lymph nodes: Cervical, supraclavicular, and axillary nodes normal. Heart:regular rate and rhythm, S1, S2 normal, no murmur, click,  rub or gallop Lung:chest clear, no wheezing, rales, normal symmetric air entry Abdomin: soft, non-tender, without masses or organomegaly EXT:no erythema, induration, or nodules   Lab Results: Lab Results  Component Value Date   WBC 11.5* 05/26/2013   HGB 14.2 05/26/2013   HCT 42.8 05/26/2013   MCV 97.1 05/26/2013   PLT 316 05/26/2013     Chemistry      Component Value  Date/Time   NA 140 05/26/2013 0909   NA 138 12/14/2012 0435   K 4.4 05/26/2013 0909   K 3.7 12/14/2012 0435   CL 104 03/24/2013 1419   CL 101 12/14/2012 0435   CO2 25 05/26/2013 0909   CO2 28 12/14/2012 0435   BUN 8.2 05/26/2013 0909   BUN 7 12/14/2012 0435   CREATININE 0.8 05/26/2013 0909   CREATININE 0.63 12/14/2012 0435      Component Value Date/Time   CALCIUM 9.9 05/26/2013 0909   CALCIUM 8.8 12/14/2012 0435   ALKPHOS 84 05/26/2013 0909   ALKPHOS 80 10/24/2012 1237   AST 14 05/26/2013 0909   AST 14 10/24/2012 1237   ALT 12 05/26/2013 0909   ALT 11 10/24/2012 1237   BILITOT 0.31 05/26/2013 0909   BILITOT 0.4 10/24/2012 1237       Radiological Studies: EXAM:  CT CHEST WITH CONTRAST  CT ABDOMEN AND PELVIS WITH AND WITHOUT CONTRAST  TECHNIQUE:  Multidetector CT imaging of the chest was performed during  intravenous contrast administration. Multidetector CT imaging of the  abdomen and pelvis was performed following the standard protocol  before and during bolus administration of intravenous contrast.  CONTRAST: OMNIPAQUE IOHEXOL 300 MG/ML SOLN  COMPARISON: 05/26/2013.  FINDINGS:  CT CHEST FINDINGS  Atherosclerosis again noted. Paucity of adipose tissues suggesting  cachexia. Coronary atherosclerosis without cardiomegaly. No  pathologic thoracic adenopathy.  Biapical pleural parenchymal scarring. Bandlike right upper lobe  density posteriorly on images 17 through 20 of series, not changed  from prior. Localized interstitial accentuation in the right upper  lobe on image 30 of series 8, no change. Stable 4 mm right upper  lobe nodule with adjacent ground-glass opacity, images 35-37 of  series, unchanged. Focal 1.3 cm ground-glass opacity in the right  upper lobe, image 44 of series, unchanged. Several faint regions  ground-glass nodularity in the left upper lobe and superior segment  left lower are unchanged.  Severe emphysema.  CT ABDOMEN AND PELVIS FINDINGS  The solid mass along the  right renal or section bed is enlarged,  currently 3.7 x 2.9 cm on image 124 of series 6, previously 2.7 x  2.2 cm by my measurements. Adjacent nodularity along the psoas  margin is present and there is progressive and worsened retrocrural  and retroperitoneal adenopathy encasing abdominal aorta much of IVC.  Confluent retrocrural adenopathy on the right Measures 4.7 3.6,  formerly 3.2 0.7. IVC is narrowed by extrinsic mass effect from the  surrounding adenopathy just above the level of the aortic  bifurcation.  Malignant node in the porta hepatis is just to the right of the  pancreatic head measures 1.5 cm in short axis, formerly 1.2 cm.  Wall thickening and enhancement of the gallbladder is observed. Left  adrenal, 1.9 x 2.0 cm, formerly 1.4 x 1 point cm, concerning for  metastatic lesion or metastatic lesion lesion. Right adrenal gland  inseparable from adjacent tumor.  Liver, spleen, pancreas, left kidney otherwise unremarkable. No  adenopathy down in the pelvis identified. Prominent stool in the  proximal  of the colon. Small bowel feces sign the distal ileum,  nonspecific.  Small calcifications in the uterus row likely from small calcified  fibroids.  Fluid density lesion along the urethra anterior margin and eccentric  to the left, 1.5 cm, with an internal calcification, query urethral  diverticulum.  Pelvic floor laxity noted with inferior displacement of the vagina  and rectum, and displacement of a part of the rectum along the right  ischiorectal fossa region.  IMPRESSION:  1. Enlarging recurrent malignancy in the prior nephrectomy bed  throughout much of the retroperitoneum, exerting mild mass effect on  the IVC.  2. Stable scattered small nodules and ground-glass opacities in the  lungs. Adenocarcinoma cannot be excluded. Not taking into account  the patient's progressive intra-abdominal malignancy which might  change management, typically in this scenario chest CT would  be  recommended in 12 months, with continued annual surveillance for a  minimum of 3 years.These recommendations are taken from  "Recommendations for the Management of Subsolid Pulmonary Nodules  Detected at CT: A Statement from the [employer]" Radiology  2013; 266:1, 304-317.  3. Wall thickening enhancement in the gallbladder, nonspecific and  possibly due to inflammation, hypoproteinemia, or poor regional  lymphatic drainage.  4. 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  5. Pelvic floor laxity.  6. Emphysema.   Impression and Plan:  72 year old woman with the following issues:  1. High-grade uterus to a carcinoma involving the right renal pelvis. She is status post right nephrectomy on February of 2014 with pathology revealing a T3 N0 disease. CT scan from 08/28/2013 was discussed today with the patient and her family and clearly showing recurrent disease. She had a local recurrence around the surgery site as well as abdominal adenopathy. Her clinical status have also declined rapidly with weight loss and decrease in her performance status. Options of treatments were discussed including palliative chemotherapy versus best supportive care. Risks and benefits of both approaches were discussed today and I feel that given her frail status and her aggressive cancer she will benefit very little from chemotherapy. Based on these findings, I have recommended hospice enrollment giving the aggressive nature of her cancer I feel that she has limited life expectancy of 6 months or less. She is agreeable to proceed without and we will make the appropriate arrangements.  2. Pain: Have given a prescription for hydrocodone to take as needed.  3. Weight loss: Given a prescription for Megace to boost her appetite.  4. Follow up: Will be as needed we will rely on hospice directions at this time.  Time spent counseling the patient and her family regarding her diagnosis and prognosis  today was 30 minutes more than 50% of that was face-to-face.  SHADAD,FIRAS, MD 12/11/201410:55 AM

## 2013-10-09 ENCOUNTER — Other Ambulatory Visit: Payer: Self-pay | Admitting: Internal Medicine

## 2013-10-09 MED ORDER — NYSTATIN 100000 UNIT/ML MT SUSP: 5.0000 mL | Freq: Four times a day (QID) | OROMUCOSAL | Status: AC

## 2013-10-09 NOTE — Progress Notes (Signed)
Pt's hospice nurse called in to report pt is c/o thrush and does have white patches in her mouth. Rx for nystatin s/sw 500000 units/ml 5ml QID x14d called in.

## 2013-10-09 NOTE — Progress Notes (Signed)
CALLED PATIENT AND TOLD HER IT WAS TOO SOON TO HAVE ANOTHER CT AND SHE UNDERSTOOD.  SHE HAD JUST HAD SCANS 09/03/13.

## 2013-11-11 ENCOUNTER — Other Ambulatory Visit: Payer: Self-pay | Admitting: *Deleted

## 2013-11-11 DIAGNOSIS — C659 Malignant neoplasm of unspecified renal pelvis: Secondary | ICD-10-CM

## 2013-11-11 MED ORDER — MEGESTROL ACETATE 400 MG/10ML PO SUSP: 400.0000 mg | Freq: Two times a day (BID) | ORAL | Status: AC

## 2013-11-11 NOTE — Telephone Encounter (Signed)
Call received from Hospice requesting refill for Megace and Furosemide 20 mg.  Asked if Hospice ordered furosemide because this is not on medication list.  Reports Dr. Grandville Silos ordered this and she will try this provider.  Uses [facility].

## 2013-12-07 ENCOUNTER — Telehealth: Payer: Self-pay | Admitting: *Deleted

## 2013-12-13 NOTE — Telephone Encounter (Signed)
Sherry with hospice calling to say patient expired at home today 4:00 pm. No future appts in epic. Note to dr Hazeline Junker desk.

## 2013-12-13 DEATH — deceased

## 2014-02-12 IMAGING — CT CT ABD-PEL WO/W CM
2 of 11 series · 12 of 46 positions shown, 17 images · IV contrast (omnipaque)
Comparison: 05/26/2013.

CLINICAL DATA: Right-sided invasive urothelial cancer, status post
nephrectomy in December 2012. Emphysema.

EXAM:
CT CHEST WITH CONTRAST
CT ABDOMEN AND PELVIS WITH AND WITHOUT CONTRAST
TECHNIQUE: Multidetector CT imaging of the chest was performed during
intravenous contrast administration. Multidetector CT imaging of the
abdomen and pelvis was performed following the standard protocol
before and during bolus administration of intravenous contrast.
CONTRAST:  100mL OMNIPAQUE IOHEXOL 300 MG/ML  SOLN

[Series 6: venous · axial · portal-venous · 0.60mm/px · z∈[-594,-69]mm · 10 of 211 slices shown, 15 images]
[im 18/211  soft-tissue]
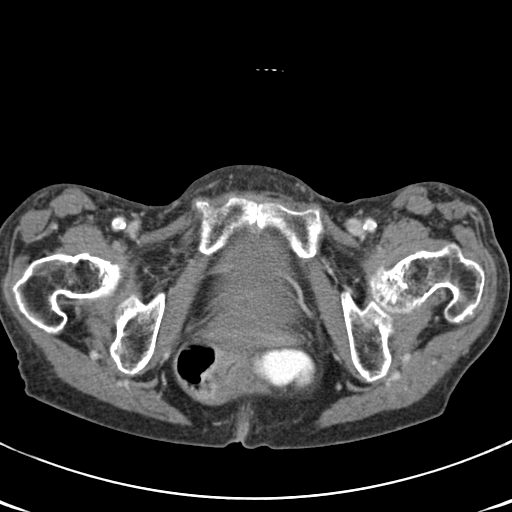
[im 18/211  bone]
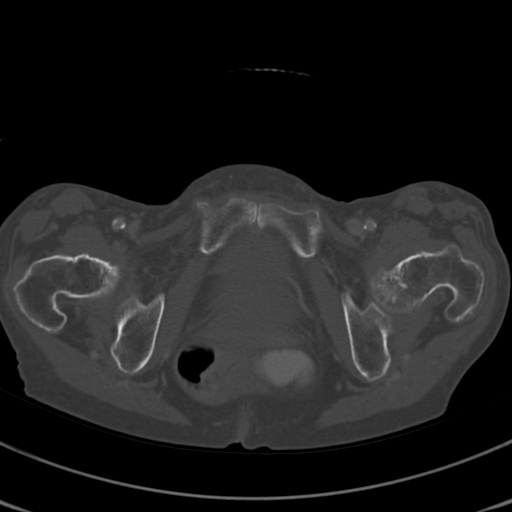
[im 36/211  soft-tissue]
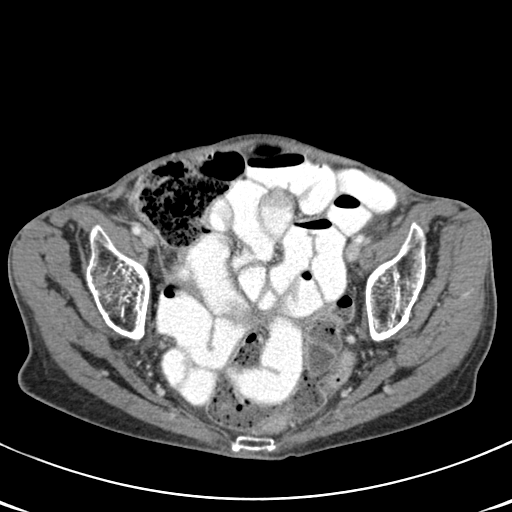
[im 71/211  soft-tissue]
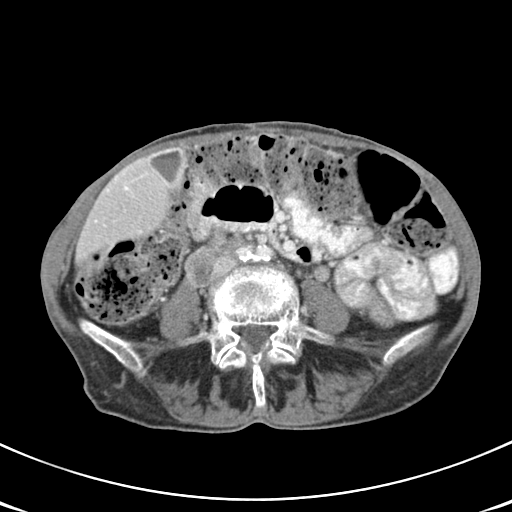
[im 88/211  soft-tissue]
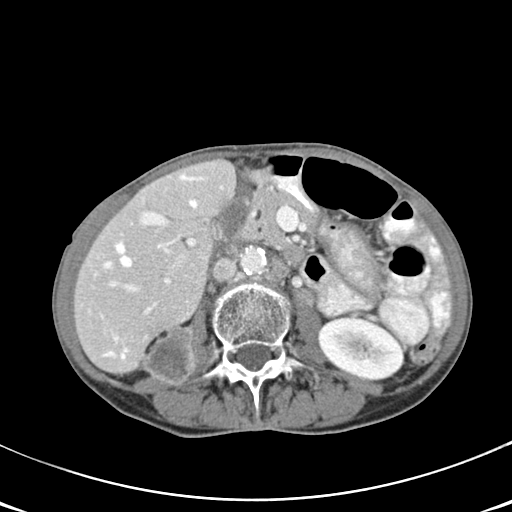
[im 106/211  soft-tissue]
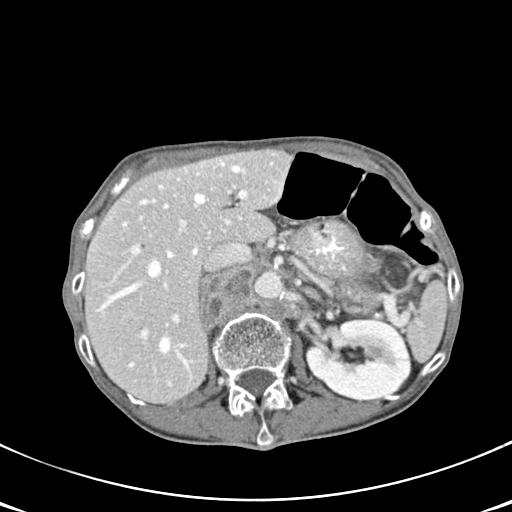
[im 123/211  soft-tissue]
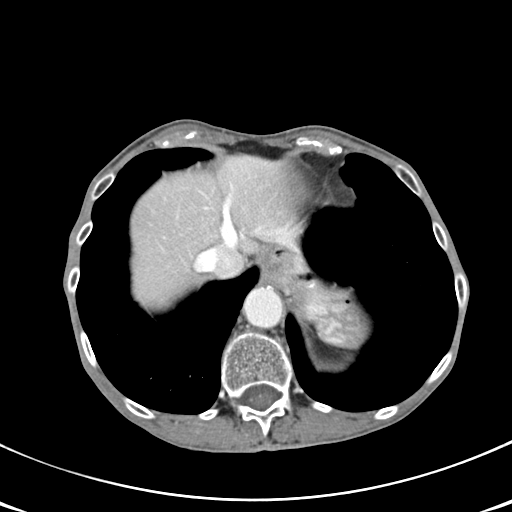
[im 141/211  soft-tissue]
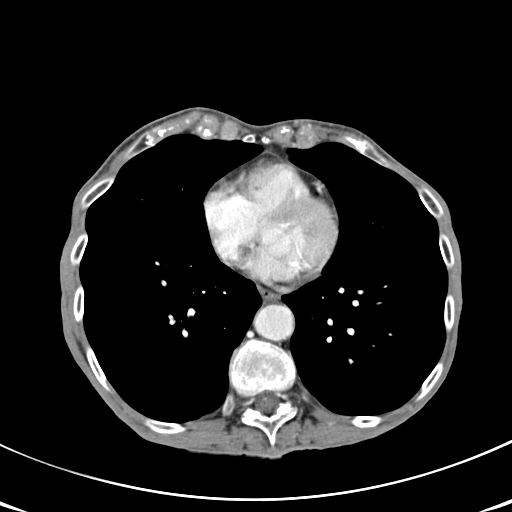
[im 141/211  lung]
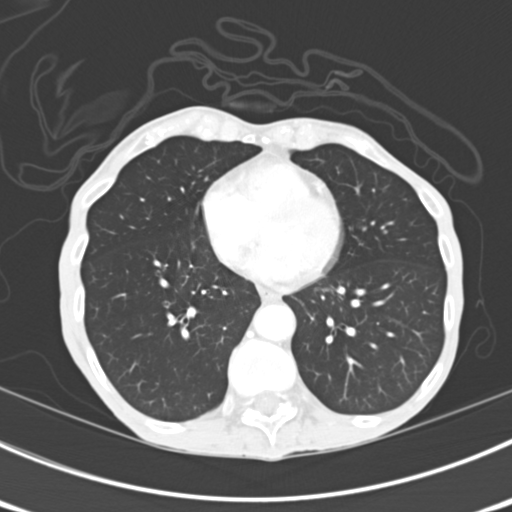
[im 158/211  lung]
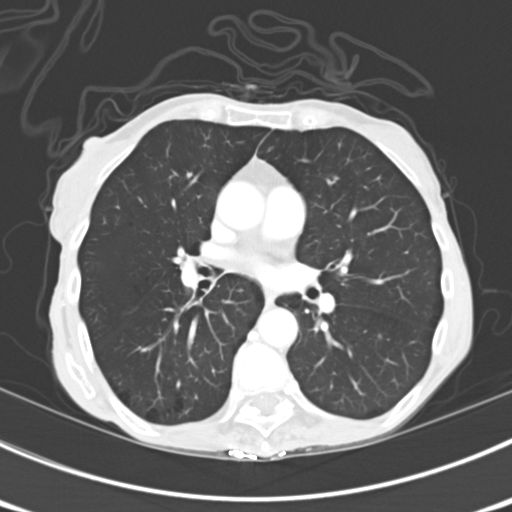
[im 176/211  soft-tissue]
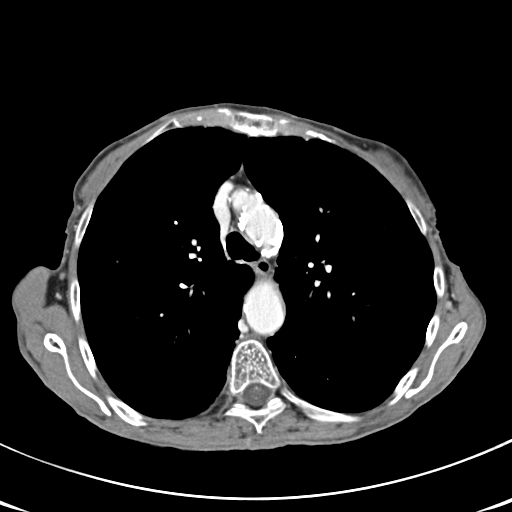
[im 176/211  lung]
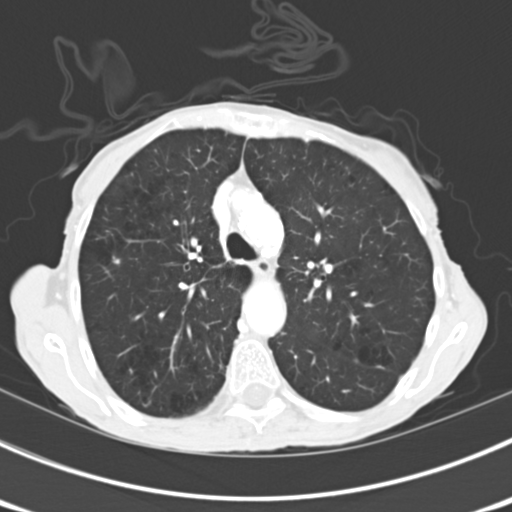
[im 193/211  soft-tissue]
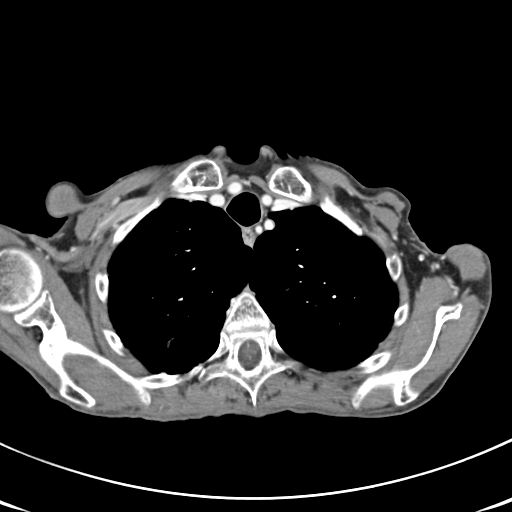
[im 193/211  lung]
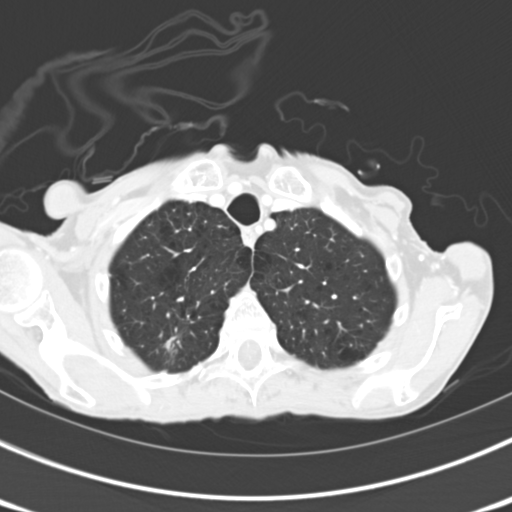
[im 193/211  bone]
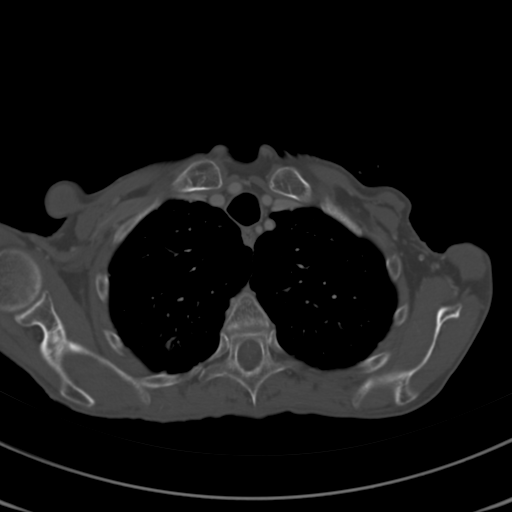

[Series 606: <mpr thick range(4)> · coronal · 1.24mm/px · 2 of 110 slices shown]
[im 37/110  soft-tissue]
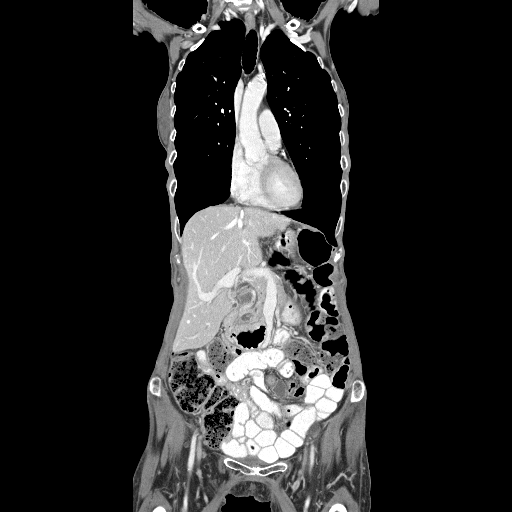
[im 73/110  soft-tissue]
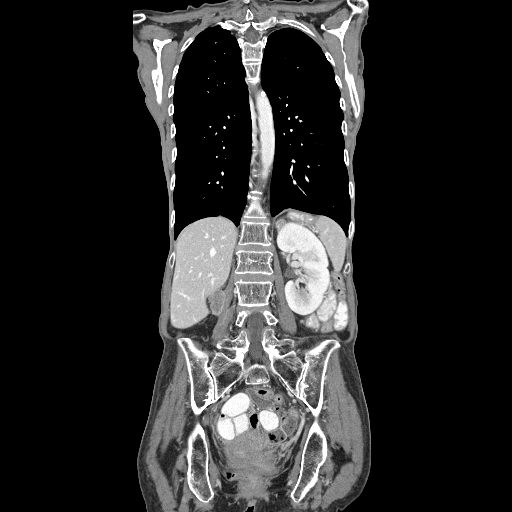

[12 of 46 positions shown; findings below may reference images not displayed]

FINDINGS: CT CHEST FINDINGS

Atherosclerosis again noted. Paucity of adipose tissues suggesting
cachexia. Coronary atherosclerosis without cardiomegaly. No
pathologic thoracic adenopathy.

Biapical pleural parenchymal scarring. Bandlike right upper lobe
density posteriorly on images 17 through 20 of series, not changed
from prior. Localized interstitial accentuation in the right upper
lobe on image 30 of series 8, no change. Stable 4 mm right upper
lobe nodule with adjacent ground-glass opacity, images 35-37 of
series, unchanged. Focal 1.3 cm ground-glass opacity in the right
upper lobe, image 44 of series, unchanged. Several faint regions
ground-glass nodularity in the left upper lobe and superior segment
left lower are unchanged.

Severe emphysema.

CT ABDOMEN AND PELVIS FINDINGS

The solid mass along the right renal or section bed is enlarged,
currently 3.7 x 2.9 cm on image 124 of series 6, previously 2.7 x
2.2 cm by my measurements. Adjacent nodularity along the psoas
margin is present and there is progressive and worsened retrocrural
and retroperitoneal adenopathy encasing abdominal aorta much of IVC.
Confluent retrocrural adenopathy on the right Measures 4.7 3.6,
formerly 3.2 0.7. IVC is narrowed by extrinsic mass effect from the
surrounding adenopathy just above the level of the aortic
bifurcation.

Malignant node in the porta hepatis is just to the right of the
pancreatic head measures 1.5 cm in short axis, formerly 1.2 cm.

Wall thickening and enhancement of the gallbladder is observed. Left
adrenal, 1.9 x 2.0 cm, formerly 1.4 x 1 point cm, concerning for
metastatic lesion or metastatic lesion lesion. Right adrenal gland
inseparable from adjacent tumor.

Liver, spleen, pancreas, left kidney otherwise unremarkable. No
adenopathy down in the pelvis identified. Prominent stool in the
proximal of the colon. Small bowel feces sign the distal ileum,
nonspecific.

Small calcifications in the uterus row likely from small calcified
fibroids.

Fluid density lesion along the urethra anterior margin and eccentric
to the left, 1.5 cm, with an internal calcification, query urethral
diverticulum.

Pelvic floor laxity noted with inferior displacement of the vagina
and rectum, and displacement of a part of the rectum along the right
ischiorectal fossa region.
IMPRESSION: 1. Enlarging recurrent malignancy in the prior nephrectomy bed
throughout much of the retroperitoneum, exerting mild mass effect on
the IVC.
2. Stable scattered small nodules and ground-glass opacities in the
lungs. Adenocarcinoma cannot be excluded. Not taking into account
the patient's progressive intra-abdominal malignancy which might
change management, typically in this scenario chest CT would be
recommended in 12 months, with continued annual surveillance for a
minimum of 3 years.These recommendations are taken from
"Recommendations for the Management of Subsolid Pulmonary Nodules
Detected at CT: A Statement from the [HOSPITAL]" Radiology
1986; [DATE], 304-317.
3. Wall thickening enhancement in the gallbladder, nonspecific and
possibly due to inflammation, hypoproteinemia, or poor regional
lymphatic drainage.
4. Suspected urethral diverticulum containing small stone.
5. Pelvic floor laxity.
6. Emphysema.
